# Patient Record
Sex: Female | Born: 1950 | Hispanic: Yes | Smoking: Light tobacco smoker
Health system: Southern US, Community
[De-identification: ages and names within clinical notes are randomized; demographics above are authoritative.]

## PROBLEM LIST (undated history)

## (undated) DIAGNOSIS — E119 Type 2 diabetes mellitus without complications: Secondary | ICD-10-CM

## (undated) DIAGNOSIS — I10 Essential (primary) hypertension: Secondary | ICD-10-CM

## (undated) DIAGNOSIS — F329 Major depressive disorder, single episode, unspecified: Secondary | ICD-10-CM

## (undated) DIAGNOSIS — F32A Depression, unspecified: Secondary | ICD-10-CM

## (undated) DIAGNOSIS — M1990 Unspecified osteoarthritis, unspecified site: Secondary | ICD-10-CM

## (undated) DIAGNOSIS — K759 Inflammatory liver disease, unspecified: Secondary | ICD-10-CM

## (undated) DIAGNOSIS — N189 Chronic kidney disease, unspecified: Secondary | ICD-10-CM

## (undated) DIAGNOSIS — F419 Anxiety disorder, unspecified: Secondary | ICD-10-CM

## (undated) DIAGNOSIS — E7800 Pure hypercholesterolemia, unspecified: Secondary | ICD-10-CM

## (undated) DIAGNOSIS — K219 Gastro-esophageal reflux disease without esophagitis: Secondary | ICD-10-CM

## (undated) DIAGNOSIS — R51 Headache: Secondary | ICD-10-CM

## (undated) HISTORY — PX: TUBAL LIGATION: SHX77

## (undated) HISTORY — PX: CYST EXCISION: SHX5701

---

## 2013-10-24 DIAGNOSIS — R519 Headache, unspecified: Secondary | ICD-10-CM

## 2013-10-24 HISTORY — DX: Headache, unspecified: R51.9

## 2014-01-21 ENCOUNTER — Inpatient Hospital Stay (HOSPITAL_COMMUNITY): Admission: RE | Admit: 2014-01-21 | Payer: Self-pay | Source: Ambulatory Visit

## 2014-01-22 ENCOUNTER — Encounter (HOSPITAL_COMMUNITY)
Admission: RE | Admit: 2014-01-22 | Discharge: 2014-01-22 | Disposition: A | Discharge status: Home / Self Care | Payer: Worker's Compensation | Source: Ambulatory Visit | Attending: Anesthesiology | Admitting: Anesthesiology

## 2014-01-22 ENCOUNTER — Encounter (HOSPITAL_COMMUNITY): Payer: Self-pay

## 2014-01-22 DIAGNOSIS — I771 Stricture of artery: Secondary | ICD-10-CM | POA: Diagnosis not present

## 2014-01-22 DIAGNOSIS — I10 Essential (primary) hypertension: Secondary | ICD-10-CM | POA: Diagnosis not present

## 2014-01-22 DIAGNOSIS — Z720 Tobacco use: Secondary | ICD-10-CM | POA: Insufficient documentation

## 2014-01-22 DIAGNOSIS — E119 Type 2 diabetes mellitus without complications: Secondary | ICD-10-CM | POA: Insufficient documentation

## 2014-01-22 DIAGNOSIS — Z01818 Encounter for other preprocedural examination: Secondary | ICD-10-CM | POA: Insufficient documentation

## 2014-01-22 HISTORY — DX: Inflammatory liver disease, unspecified: K75.9

## 2014-01-22 HISTORY — DX: Anxiety disorder, unspecified: F41.9

## 2014-01-22 HISTORY — DX: Major depressive disorder, single episode, unspecified: F32.9

## 2014-01-22 HISTORY — DX: Headache: R51

## 2014-01-22 HISTORY — DX: Pure hypercholesterolemia, unspecified: E78.00

## 2014-01-22 HISTORY — DX: Essential (primary) hypertension: I10

## 2014-01-22 HISTORY — DX: Chronic kidney disease, unspecified: N18.9

## 2014-01-22 HISTORY — DX: Depression, unspecified: F32.A

## 2014-01-22 HISTORY — DX: Type 2 diabetes mellitus without complications: E11.9

## 2014-01-22 HISTORY — DX: Unspecified osteoarthritis, unspecified site: M19.90

## 2014-01-22 HISTORY — DX: Gastro-esophageal reflux disease without esophagitis: K21.9

## 2014-01-22 LAB — BASIC METABOLIC PANEL
Anion gap: 13 (ref 5–15)
BUN: 15 mg/dL (ref 6–23)
CO2: 24 meq/L (ref 19–32)
Calcium: 9.3 mg/dL (ref 8.4–10.5)
Chloride: 103 mEq/L (ref 96–112)
Creatinine, Ser: 0.52 mg/dL (ref 0.50–1.10)
GFR calc non Af Amer: >90 mL/min (ref >90)
Glucose, Bld: 110 mg/dL — ABNORMAL HIGH (ref 70–99)
POTASSIUM: 4.4 meq/L (ref 3.7–5.3)
Sodium: 140 mEq/L (ref 137–147)

## 2014-01-22 LAB — CBC
HEMATOCRIT: 43.7 % (ref 36.0–46.0)
HEMOGLOBIN: 14.7 g/dL (ref 12.0–15.0)
MCH: 28.7 pg (ref 26.0–34.0)
MCHC: 33.6 g/dL (ref 30.0–36.0)
MCV: 85.2 fL (ref 78.0–100.0)
Platelets: 249 10*3/uL (ref 150–400)
RBC: 5.13 MIL/uL — AB (ref 3.87–5.11)
RDW: 12.7 % (ref 11.5–15.5)
WBC: 6.5 10*3/uL (ref 4.0–10.5)

## 2014-01-22 LAB — TYPE AND SCREEN
ABO/RH(D): O POS
ANTIBODY SCREEN: NEGATIVE

## 2014-01-22 LAB — ABO/RH: ABO/RH(D): O POS

## 2014-01-22 LAB — SURGICAL PCR SCREEN
MRSA, PCR: NEGATIVE
STAPHYLOCOCCUS AUREUS: POSITIVE — AB

## 2014-01-22 NOTE — Progress Notes (Addendum)
Denies seeing a cardiologist or PCP, goes to a clinic when needed, also gets her medications from the clinic. Pt did not bring in her meds today, instructed to call pharm with info about what meds she is taking. Voices understanding.  Interpreter at side Daphine Deutscher([place]).  Reports that her CBG's run on average in the 120's. Instructed not to take her Dm meds the morning of surgery, voices understanding. Denies having a recent EKG or CXR, denies ever having a stress test, echo,or card cath. Interpreter has been requested for the DOS.

## 2014-01-22 NOTE — H&P (Signed)
Erica Hudson is an 63 y.o. female.   History of Present Illness  The patient is a 63 year old female who presents today for follow up of their back. The patient is being followed for their left low back pain that radiates down the posterior thigh to the foot back pain. Symptoms reported today include: pain (when bending), pain at night, aching, throbbing, stiffness, weakness (left leg for about one month), numbness, leg pain (left), foot pain, pain with lifting and pain with standing. The patient states that they are doing poorly. Current treatment includes: NSAIDs (Naprosyn). The following medication has been used for pain control: antiinflammatory medication. The patient reports their current pain level to be 7 / 10.  Additional reasons for visit:  H & P is described as the following: The patient is scheduled for a TLIF L4-5, LEFT L5-S1Decompression to be performed by Dr. Duane Lope D. Rolena Infante, MD at [facility] on 10.29.15 .  Allergies Hassie Bruce Toomes; 01/22/2014 2:05 PM) No Known Drug Allergies10/07/2011  Family History No pertinent family history First Degree Relatives.  Social History Alcohol use never consumed alcohol Children 5 or more Current work status working full time Drug/Alcohol Rehab (Currently) no Drug/Alcohol Rehab (Previously) no Exercise Exercises daily; does running / walking Illicit drug use no Living situation live with parents Marital status widowed Number of flights of stairs before winded 2-3 Pain Contract no Tobacco use Never smoker. never smoker  Medication History Naprosyn (Oral) Specific dose unknown - Active. (bid) Accupril (Oral) Specific dose unknown - Active. (pt stopped taking due to dizziness) MetFORMIN HCl (Oral) Specific dose unknown - Active. (bid) Crestor (Oral) Specific dose unknown - Active. (has been changed but she does not recall the new Rx) Lovaza (Oral) Specific dose unknown - Active. (qd) Medications  Reconciled  Vitals  01/22/2014 2:04 PM Weight: 141 lb Height: 64in Body Surface Area: 1.7 m Body Mass Index: 24.2 kg/m Pulse: 68 (Regular)  BP: 135/81 (Sitting, Left Arm, Standard)   Past Medical History  Diagnosis Date  . High cholesterol   . Diabetes mellitus without complication   . Hypertension   . Depression   . Anxiety   . Chronic kidney disease   . GERD (gastroesophageal reflux disease)   . Headache October 24, 2013    mirror hit her head, and headaches at times since this  . Arthritis   . Hepatitis     Past Surgical History  Procedure Laterality Date  . Cyst excision      cyst removed from bladder  . Tubal ligation      No family history on file. Social History:  reports that she has been smoking Cigarettes.  She has been smoking about 0.25 packs per day. She does not have any smokeless tobacco history on file. She reports that she does not drink alcohol or use illicit drugs.  Allergies: Not on File  No prescriptions prior to admission    Results for orders placed during the hospital encounter of 01/22/14 (from the past 48 hour(s))  SURGICAL PCR SCREEN     Status: Abnormal   Collection Time    01/22/14 10:38 AM      Result Value Ref Range   MRSA, PCR NEGATIVE  NEGATIVE   Staphylococcus aureus POSITIVE (*) NEGATIVE   Comment:            The Xpert SA Assay (FDA     approved for NASAL specimens     in patients over 40 years of age),  is one component of     a comprehensive surveillance     program.  Test performance has     been validated by [facility] for patients greater     than or equal to 33 year old.     It is not intended     to diagnose infection nor to     guide or monitor treatment.  BASIC METABOLIC PANEL     Status: Abnormal   Collection Time    01/22/14 10:52 AM      Result Value Ref Range   Sodium 140  137 - 147 mEq/L   Potassium 4.4  3.7 - 5.3 mEq/L   Chloride 103  96 - 112 mEq/L   CO2 24  19 - 32 mEq/L   Glucose,  Bld 110 (*) 70 - 99 mg/dL   BUN 15  6 - 23 mg/dL   Creatinine, Ser 0.52  0.50 - 1.10 mg/dL   Calcium 9.3  8.4 - 10.5 mg/dL   GFR calc non Af Amer >90  >90 mL/min   GFR calc Af Amer >90  >90 mL/min   Comment: (NOTE)     The eGFR has been calculated using the CKD EPI equation.     This calculation has not been validated in all clinical situations.     eGFR's persistently <90 mL/min signify possible Chronic Kidney     Disease.   Anion gap 13  5 - 15  CBC     Status: Abnormal   Collection Time    01/22/14 10:52 AM      Result Value Ref Range   WBC 6.5  4.0 - 10.5 K/uL   RBC 5.13 (*) 3.87 - 5.11 MIL/uL   Hemoglobin 14.7  12.0 - 15.0 g/dL   HCT 43.7  36.0 - 46.0 %   MCV 85.2  78.0 - 100.0 fL   MCH 28.7  26.0 - 34.0 pg   MCHC 33.6  30.0 - 36.0 g/dL   RDW 12.7  11.5 - 15.5 %   Platelets 249  150 - 400 K/uL   Dg Chest 2 View  01/22/2014   CLINICAL DATA:  Preop for lumbar fusion. History of hypertension, diabetes and smoking.  EXAM: CHEST  2 VIEW  COMPARISON:  None.  FINDINGS: Cardiac silhouette is normal in size. Aorta is mildly uncoiled and tortuous. No mediastinal or hilar masses or evidence of adenopathy. Lungs are mildly hyperexpanded but clear. No pleural effusion or pneumothorax.  Bony thorax is demineralized but intact.  IMPRESSION: No acute cardiopulmonary disease.   Electronically Signed   By: Lajean Manes M.D.   On: 01/22/2014 11:42    Review of Systems  Constitutional: Negative.   HENT: Negative.   Eyes: Negative.   Respiratory: Negative.   Cardiovascular: Negative.   Gastrointestinal: Positive for abdominal pain (left lower abd. ). Negative for diarrhea, constipation and blood in stool.  Genitourinary: Negative.   Musculoskeletal: Positive for back pain.  Skin: Negative.   Neurological: Negative.   Psychiatric/Behavioral: Negative.     There were no vitals taken for this visit. Physical Exam  Constitutional: She is oriented to person, place, and time. She appears  well-developed and well-nourished.  HENT:  Head: Normocephalic and atraumatic.  Eyes: Pupils are equal, round, and reactive to light.  Neck: Normal range of motion.  Cardiovascular: Normal rate and regular rhythm.   Respiratory: Effort normal and breath sounds normal.  GI: Soft. Bowel sounds are normal.  She exhibits no mass. There is tenderness (left lower and upper quadrant.  ). There is no rebound.  Musculoskeletal: Normal range of motion.  Neurological: She is alert and oriented to person, place, and time.  Skin: Skin is warm and dry.  Psychiatric: She has a normal mood and affect.     Assessment/Plan L4-5, L5-S1 ddd/stenosis.  Proceed with L4-5 TLIF and L5-S1 decompression.    Posterior decompression/Fusion:Risks of surgery include infection, bleeding, nerve damage, death, stroke, paralysis, failure to heal, need for further surgery, ongoing or worse pain, need for further surgery, CSF leak, loss of bowel or bladder, and recurrent disc herniation or stenosis which would necessitate need for further surgery. Non-union, hardware failure, adjacent segment disease and recurrent pain. Hardware breakage, mal-position requiring surgery to correct or remove.     Uchenna Rappaport M 01/22/2014, 2:52 PM

## 2014-01-22 NOTE — Pre-Procedure Instructions (Signed)
Erica GroutMicaela Hudson  01/22/2014   Your procedure is scheduled on: Thursday, January 28, 2014  [facility] at 10:15 AM.  Call this number if you have problems the morning of surgery: [phone]   Remember:   Do not eat food or drink liquids after midnight Wednesday, January 27, 2014   Take these medicines the morning of surgery with A SIP OF WATER: Pain med if needed  Stop taking Aspirin, Herbal medications, Fish Oil, Ibuprofen, BC's, Goody's   Do not wear jewelry, make-up or nail polish.  Do not wear lotions, powders, or perfumes. You may not wear deodorant.  Do not shave 48 hours prior to surgery.   Do not bring valuables to the hospital.  [facility] is not [facility] for any belongings or valuables.               Contacts, dentures or bridgework may not be worn into surgery.  Leave suitcase in the car. After surgery it may be brought to your room.  For patients admitted to the hospital, discharge time is determined by your treatment team.               Patients discharged the day of surgery will not be allowed to drive home.  Name and phone number of your driver:   Special Instructions:  Special Instructions:Special Instructions: [facility] - Pre[facility]  Before surgery, you can play an important role.  Because skin is not sterile, your skin needs to be as free of germs as possible.  You can reduce the number of germs on you skin by washing with CHG (chlorahexidine gluconate) soap before surgery.  CHG is an antiseptic cleaner which kills germs and bonds with the skin to continue killing germs even after washing.  Please DO NOT use if you have an allergy to CHG or antibacterial soaps.  If your skin becomes reddened/irritated stop using the CHG and inform your nurse when you arrive at Short Stay.  Do not shave (including legs and underarms) for at least 48 hours prior to the first CHG shower.  You may shave your face.  Please follow these  instructions carefully:   1.  Shower with CHG Soap the night before surgery and the morning of Surgery.  2.  If you choose to wash your hair, wash your hair first as usual with your normal shampoo.  3.  After you shampoo, rinse your hair and body thoroughly to remove the Shampoo.  4.  Use CHG as you would any other liquid soap.  You can apply chg directly  to the skin and wash gently with scrungie or a clean washcloth.  5.  Apply the CHG Soap to your body ONLY FROM THE NECK DOWN.  Do not use on open wounds or open sores.  Avoid contact with your eyes, ears, mouth and genitals (private parts).  Wash genitals (private parts) with your normal soap.  6.  Wash thoroughly, paying special attention to the area where your surgery will be performed.  7.  Thoroughly rinse your body with warm water from the neck down.  8.  DO NOT shower/wash with your normal soap after using and rinsing off the CHG Soap.  9.  Pat yourself dry with a clean towel.            10.  Wear clean pajamas.            11.  Place clean sheets on your bed the  night of your first shower and do not sleep with pets.  Day of Surgery  Do not apply any lotions/deodorants the morning of surgery.  Please wear clean clothes to the hospital/surgery center.   Please read over the following fact sheets that you were given: Pain Booklet, Coughing and Deep Breathing, Blood Transfusion Information, MRSA Information and Surgical Site Infection Prevention

## 2014-01-22 NOTE — Progress Notes (Signed)
[facility] [phone] script called for Mupirocin message left on Ms Erica LollingRamirez sister in laws voice mail per her request.

## 2014-01-27 MED ORDER — DEXAMETHASONE SODIUM PHOSPHATE 4 MG/ML IJ SOLN
4.0000 mg | INTRAMUSCULAR | Status: AC
  Administered 2014-01-28: 10 mg via INTRAVENOUS
  Filled 2014-01-27: qty 1

## 2014-01-27 MED ORDER — ACETAMINOPHEN 10 MG/ML IV SOLN
1000.0000 mg | INTRAVENOUS | Status: AC
  Administered 2014-01-28: 1000 mg via INTRAVENOUS
  Filled 2014-01-27: qty 100

## 2014-01-28 ENCOUNTER — Inpatient Hospital Stay (HOSPITAL_COMMUNITY): Payer: Worker's Compensation | Admitting: Anesthesiology

## 2014-01-28 ENCOUNTER — Encounter (HOSPITAL_COMMUNITY): Payer: Worker's Compensation | Admitting: Anesthesiology

## 2014-01-28 ENCOUNTER — Inpatient Hospital Stay (HOSPITAL_COMMUNITY): Payer: Worker's Compensation

## 2014-01-28 ENCOUNTER — Encounter (HOSPITAL_COMMUNITY): Payer: Self-pay | Admitting: Anesthesiology

## 2014-01-28 ENCOUNTER — Encounter (HOSPITAL_COMMUNITY)
Admission: RE | Disposition: A | Discharge status: Home Health | Payer: Self-pay | Source: Ambulatory Visit | Attending: Orthopedic Surgery

## 2014-01-28 ENCOUNTER — Inpatient Hospital Stay (HOSPITAL_COMMUNITY)
Admission: RE | Admit: 2014-01-28 | Discharge: 2014-01-31 | DRG: 460 | Disposition: A | Discharge status: Home Health | Payer: Worker's Compensation | Source: Ambulatory Visit | Attending: Orthopedic Surgery | Admitting: Orthopedic Surgery

## 2014-01-28 DIAGNOSIS — N189 Chronic kidney disease, unspecified: Secondary | ICD-10-CM | POA: Diagnosis present

## 2014-01-28 DIAGNOSIS — M549 Dorsalgia, unspecified: Secondary | ICD-10-CM | POA: Diagnosis present

## 2014-01-28 DIAGNOSIS — M5137 Other intervertebral disc degeneration, lumbosacral region: Secondary | ICD-10-CM | POA: Diagnosis present

## 2014-01-28 DIAGNOSIS — Y831 Surgical operation with implant of artificial internal device as the cause of abnormal reaction of the patient, or of later complication, without mention of misadventure at the time of the procedure: Secondary | ICD-10-CM | POA: Diagnosis not present

## 2014-01-28 DIAGNOSIS — Y92234 Operating room of hospital as the place of occurrence of the external cause: Secondary | ICD-10-CM | POA: Diagnosis not present

## 2014-01-28 DIAGNOSIS — F329 Major depressive disorder, single episode, unspecified: Secondary | ICD-10-CM | POA: Diagnosis present

## 2014-01-28 DIAGNOSIS — K219 Gastro-esophageal reflux disease without esophagitis: Secondary | ICD-10-CM | POA: Diagnosis present

## 2014-01-28 DIAGNOSIS — M4806 Spinal stenosis, lumbar region: Secondary | ICD-10-CM | POA: Diagnosis present

## 2014-01-28 DIAGNOSIS — T84226A Displacement of internal fixation device of vertebrae, initial encounter: Secondary | ICD-10-CM | POA: Diagnosis not present

## 2014-01-28 DIAGNOSIS — I129 Hypertensive chronic kidney disease with stage 1 through stage 4 chronic kidney disease, or unspecified chronic kidney disease: Secondary | ICD-10-CM | POA: Diagnosis present

## 2014-01-28 DIAGNOSIS — K5900 Constipation, unspecified: Secondary | ICD-10-CM | POA: Diagnosis not present

## 2014-01-28 DIAGNOSIS — F419 Anxiety disorder, unspecified: Secondary | ICD-10-CM | POA: Diagnosis present

## 2014-01-28 DIAGNOSIS — Z79899 Other long term (current) drug therapy: Secondary | ICD-10-CM | POA: Diagnosis not present

## 2014-01-28 DIAGNOSIS — Z419 Encounter for procedure for purposes other than remedying health state, unspecified: Secondary | ICD-10-CM

## 2014-01-28 DIAGNOSIS — M4316 Spondylolisthesis, lumbar region: Secondary | ICD-10-CM | POA: Diagnosis present

## 2014-01-28 DIAGNOSIS — F17210 Nicotine dependence, cigarettes, uncomplicated: Secondary | ICD-10-CM | POA: Diagnosis present

## 2014-01-28 DIAGNOSIS — Z981 Arthrodesis status: Secondary | ICD-10-CM

## 2014-01-28 DIAGNOSIS — E119 Type 2 diabetes mellitus without complications: Secondary | ICD-10-CM | POA: Diagnosis present

## 2014-01-28 DIAGNOSIS — E780 Pure hypercholesterolemia: Secondary | ICD-10-CM | POA: Diagnosis present

## 2014-01-28 DIAGNOSIS — M545 Low back pain: Secondary | ICD-10-CM | POA: Diagnosis present

## 2014-01-28 LAB — GLUCOSE, CAPILLARY
GLUCOSE-CAPILLARY: 141 mg/dL — AB (ref 70–99)
GLUCOSE-CAPILLARY: 160 mg/dL — AB (ref 70–99)
GLUCOSE-CAPILLARY: 173 mg/dL — AB (ref 70–99)
Glucose-Capillary: 108 mg/dL — ABNORMAL HIGH (ref 70–99)

## 2014-01-28 LAB — PROTIME-INR
INR: 1.01 (ref 0.00–1.49)
Prothrombin Time: 13.4 seconds (ref 11.6–15.2)

## 2014-01-28 LAB — APTT: APTT: 33 s (ref 24–37)

## 2014-01-28 SURGERY — POSTERIOR LUMBAR FUSION 1 LEVEL
Anesthesia: General | Laterality: Left

## 2014-01-28 MED ORDER — PROPOFOL 10 MG/ML IV BOLUS
INTRAVENOUS | Status: AC
  Filled 2014-01-28: qty 20

## 2014-01-28 MED ORDER — PROPOFOL INFUSION 10 MG/ML OPTIME
INTRAVENOUS | Status: DC | PRN
  Administered 2014-01-28: 75 ug/kg/min via INTRAVENOUS

## 2014-01-28 MED ORDER — LACTATED RINGERS IV SOLN
INTRAVENOUS | Status: DC | PRN
  Administered 2014-01-28 (×3): via INTRAVENOUS

## 2014-01-28 MED ORDER — INFLUENZA VAC SPLIT QUAD 0.5 ML IM SUSY
0.5000 mL | PREFILLED_SYRINGE | INTRAMUSCULAR | Status: AC
  Administered 2014-01-29: 0.5 mL via INTRAMUSCULAR
  Filled 2014-01-28: qty 0.5

## 2014-01-28 MED ORDER — THROMBIN 20000 UNITS EX SOLR
CUTANEOUS | Status: AC
  Filled 2014-01-28: qty 20000

## 2014-01-28 MED ORDER — MIDAZOLAM HCL 2 MG/2ML IJ SOLN
INTRAMUSCULAR | Status: AC
  Filled 2014-01-28: qty 2

## 2014-01-28 MED ORDER — BUPIVACAINE-EPINEPHRINE (PF) 0.25% -1:200000 IJ SOLN
INTRAMUSCULAR | Status: AC
  Filled 2014-01-28: qty 30

## 2014-01-28 MED ORDER — OXYCODONE HCL 5 MG PO TABS
10.0000 mg | ORAL_TABLET | ORAL | Status: DC | PRN
  Administered 2014-01-28 – 2014-01-31 (×10): 10 mg via ORAL
  Filled 2014-01-28 (×10): qty 2

## 2014-01-28 MED ORDER — FENTANYL CITRATE 0.05 MG/ML IJ SOLN
INTRAMUSCULAR | Status: AC
  Filled 2014-01-28: qty 5

## 2014-01-28 MED ORDER — ONDANSETRON HCL 4 MG/2ML IJ SOLN: 4.0000 mg | INTRAMUSCULAR | Status: DC | PRN

## 2014-01-28 MED ORDER — LISINOPRIL 10 MG PO TABS
10.0000 mg | ORAL_TABLET | Freq: Every day | ORAL | Status: DC
  Administered 2014-01-29 – 2014-01-31 (×3): 10 mg via ORAL
  Filled 2014-01-28 (×3): qty 1

## 2014-01-28 MED ORDER — PHENYLEPHRINE HCL 10 MG/ML IJ SOLN
INTRAMUSCULAR | Status: DC | PRN
  Administered 2014-01-28 (×3): 40 ug via INTRAVENOUS
  Administered 2014-01-28: 80 ug via INTRAVENOUS
  Administered 2014-01-28 (×2): 40 ug via INTRAVENOUS

## 2014-01-28 MED ORDER — METFORMIN HCL 500 MG PO TABS
1000.0000 mg | ORAL_TABLET | Freq: Two times a day (BID) | ORAL | Status: DC
  Administered 2014-01-28 – 2014-01-31 (×6): 1000 mg via ORAL
  Filled 2014-01-28 (×8): qty 2

## 2014-01-28 MED ORDER — DEXAMETHASONE SODIUM PHOSPHATE 4 MG/ML IJ SOLN
4.0000 mg | Freq: Four times a day (QID) | INTRAMUSCULAR | Status: DC
  Filled 2014-01-28 (×15): qty 1

## 2014-01-28 MED ORDER — LACTATED RINGERS IV SOLN: INTRAVENOUS | Status: DC

## 2014-01-28 MED ORDER — LIDOCAINE HCL (CARDIAC) 20 MG/ML IV SOLN
INTRAVENOUS | Status: AC
  Filled 2014-01-28: qty 5

## 2014-01-28 MED ORDER — MORPHINE SULFATE 2 MG/ML IJ SOLN: 1.0000 mg | INTRAMUSCULAR | Status: DC | PRN

## 2014-01-28 MED ORDER — PNEUMOCOCCAL VAC POLYVALENT 25 MCG/0.5ML IJ INJ
0.5000 mL | INJECTION | INTRAMUSCULAR | Status: AC
  Administered 2014-01-29: 0.5 mL via INTRAMUSCULAR
  Filled 2014-01-28: qty 0.5

## 2014-01-28 MED ORDER — DEXAMETHASONE SODIUM PHOSPHATE 10 MG/ML IJ SOLN
INTRAMUSCULAR | Status: AC
  Filled 2014-01-28: qty 1

## 2014-01-28 MED ORDER — BUPIVACAINE-EPINEPHRINE 0.25% -1:200000 IJ SOLN
INTRAMUSCULAR | Status: DC | PRN
  Administered 2014-01-28: 20 mL

## 2014-01-28 MED ORDER — ONDANSETRON HCL 4 MG/2ML IJ SOLN
INTRAMUSCULAR | Status: AC
  Filled 2014-01-28: qty 2

## 2014-01-28 MED ORDER — METHOCARBAMOL 500 MG PO TABS
500.0000 mg | ORAL_TABLET | Freq: Four times a day (QID) | ORAL | Status: DC | PRN
  Administered 2014-01-29 – 2014-01-30 (×5): 500 mg via ORAL
  Filled 2014-01-28 (×6): qty 1

## 2014-01-28 MED ORDER — 0.9 % SODIUM CHLORIDE (POUR BTL) OPTIME
TOPICAL | Status: DC | PRN
  Administered 2014-01-28: 1000 mL

## 2014-01-28 MED ORDER — METHOCARBAMOL 1000 MG/10ML IJ SOLN
500.0000 mg | Freq: Four times a day (QID) | INTRAVENOUS | Status: DC | PRN
  Filled 2014-01-28: qty 5

## 2014-01-28 MED ORDER — PROPOFOL 10 MG/ML IV BOLUS
INTRAVENOUS | Status: DC | PRN
  Administered 2014-01-28: 100 mg via INTRAVENOUS

## 2014-01-28 MED ORDER — GELATIN ABSORBABLE MT POWD
OROMUCOSAL | Status: DC | PRN
  Administered 2014-01-28: 12:00:00 via TOPICAL

## 2014-01-28 MED ORDER — CEFAZOLIN SODIUM 1-5 GM-% IV SOLN
1.0000 g | Freq: Three times a day (TID) | INTRAVENOUS | Status: AC
  Administered 2014-01-28 – 2014-01-29 (×2): 1 g via INTRAVENOUS
  Filled 2014-01-28 (×2): qty 50

## 2014-01-28 MED ORDER — SODIUM CHLORIDE 0.9 % IJ SOLN: 3.0000 mL | INTRAMUSCULAR | Status: DC | PRN

## 2014-01-28 MED ORDER — LIDOCAINE HCL (CARDIAC) 20 MG/ML IV SOLN
INTRAVENOUS | Status: DC | PRN
  Administered 2014-01-28: 60 mg via INTRAVENOUS

## 2014-01-28 MED ORDER — SUCCINYLCHOLINE CHLORIDE 20 MG/ML IJ SOLN
INTRAMUSCULAR | Status: DC | PRN
  Administered 2014-01-28: 100 mg via INTRAVENOUS

## 2014-01-28 MED ORDER — ACETAMINOPHEN 10 MG/ML IV SOLN
1000.0000 mg | Freq: Four times a day (QID) | INTRAVENOUS | Status: AC
  Administered 2014-01-28 – 2014-01-29 (×4): 1000 mg via INTRAVENOUS
  Filled 2014-01-28 (×5): qty 100

## 2014-01-28 MED ORDER — CEFAZOLIN SODIUM-DEXTROSE 2-3 GM-% IV SOLR
2.0000 g | INTRAVENOUS | Status: AC
  Administered 2014-01-28: 2 g via INTRAVENOUS
  Filled 2014-01-28: qty 50

## 2014-01-28 MED ORDER — PHENOL 1.4 % MT LIQD: 1.0000 | OROMUCOSAL | Status: DC | PRN

## 2014-01-28 MED ORDER — HEMOSTATIC AGENTS (NO CHARGE) OPTIME
TOPICAL | Status: DC | PRN
  Administered 2014-01-28 (×2): 1 via TOPICAL

## 2014-01-28 MED ORDER — LINAGLIPTIN 5 MG PO TABS
5.0000 mg | ORAL_TABLET | Freq: Every day | ORAL | Status: DC
  Administered 2014-01-29 – 2014-01-31 (×3): 5 mg via ORAL
  Filled 2014-01-28 (×4): qty 1

## 2014-01-28 MED ORDER — MENTHOL 3 MG MT LOZG: 1.0000 | LOZENGE | OROMUCOSAL | Status: DC | PRN

## 2014-01-28 MED ORDER — MIDAZOLAM HCL 5 MG/5ML IJ SOLN
INTRAMUSCULAR | Status: DC | PRN
  Administered 2014-01-28: 2 mg via INTRAVENOUS

## 2014-01-28 MED ORDER — ROCURONIUM BROMIDE 50 MG/5ML IV SOLN
INTRAVENOUS | Status: AC
  Filled 2014-01-28: qty 1

## 2014-01-28 MED ORDER — FENTANYL CITRATE 0.05 MG/ML IJ SOLN
INTRAMUSCULAR | Status: DC | PRN
  Administered 2014-01-28 (×3): 50 ug via INTRAVENOUS
  Administered 2014-01-28: 100 ug via INTRAVENOUS
  Administered 2014-01-28 (×3): 50 ug via INTRAVENOUS

## 2014-01-28 MED ORDER — SITAGLIPTIN PHOS-METFORMIN HCL 50-1000 MG PO TABS: 1.0000 | ORAL_TABLET | Freq: Two times a day (BID) | ORAL | Status: DC

## 2014-01-28 MED ORDER — ONDANSETRON HCL 4 MG/2ML IJ SOLN
INTRAMUSCULAR | Status: DC | PRN
  Administered 2014-01-28: 4 mg via INTRAVENOUS

## 2014-01-28 MED ORDER — LACTATED RINGERS IV SOLN
INTRAVENOUS | Status: DC
  Administered 2014-01-28: 11:00:00 via INTRAVENOUS

## 2014-01-28 MED ORDER — DEXAMETHASONE 4 MG PO TABS
4.0000 mg | ORAL_TABLET | Freq: Four times a day (QID) | ORAL | Status: DC
  Administered 2014-01-28 – 2014-01-31 (×12): 4 mg via ORAL
  Filled 2014-01-28 (×16): qty 1

## 2014-01-28 MED ORDER — PRAVASTATIN SODIUM 20 MG PO TABS
20.0000 mg | ORAL_TABLET | Freq: Every day | ORAL | Status: DC
  Administered 2014-01-28 – 2014-01-30 (×3): 20 mg via ORAL
  Filled 2014-01-28 (×4): qty 1

## 2014-01-28 MED ORDER — SODIUM CHLORIDE 0.9 % IV SOLN: 250.0000 mL | INTRAVENOUS | Status: DC

## 2014-01-28 MED ORDER — SODIUM CHLORIDE 0.9 % IJ SOLN
3.0000 mL | Freq: Two times a day (BID) | INTRAMUSCULAR | Status: DC
  Administered 2014-01-29 – 2014-01-31 (×2): 3 mL via INTRAVENOUS

## 2014-01-28 SURGICAL SUPPLY — 79 items
BLADE DERMATOME SS (BLADE) ×3 IMPLANT
BLADE SURG ROTATE 9660 (MISCELLANEOUS) IMPLANT
BUR EGG ELITE 4.0 (BURR) IMPLANT
BUR EGG ELITE 4.0MM (BURR)
CLIP NEUROVISION LG (CLIP) ×3 IMPLANT
CLOSURE STERI-STRIP 1/2X4 (GAUZE/BANDAGES/DRESSINGS) ×1
CLOSURE WOUND 1/2 X4 (GAUZE/BANDAGES/DRESSINGS) ×1
CLSR STERI-STRIP ANTIMIC 1/2X4 (GAUZE/BANDAGES/DRESSINGS) ×2 IMPLANT
CORDS BIPOLAR (ELECTRODE) ×3 IMPLANT
COVER MAYO STAND STRL (DRAPES) ×6 IMPLANT
COVER SURGICAL LIGHT HANDLE (MISCELLANEOUS) ×3 IMPLANT
DRAPE C-ARM 42X72 X-RAY (DRAPES) ×3 IMPLANT
DRAPE C-ARMOR (DRAPES) ×3 IMPLANT
DRAPE ORTHO SPLIT 77X108 STRL (DRAPES) ×2
DRAPE POUCH INSTRU U-SHP 10X18 (DRAPES) ×3 IMPLANT
DRAPE SURG 17X23 STRL (DRAPES) ×3 IMPLANT
DRAPE SURG ORHT 6 SPLT 77X108 (DRAPES) ×1 IMPLANT
DRAPE U-SHAPE 47X51 STRL (DRAPES) ×3 IMPLANT
DRSG MEPILEX BORDER 4X8 (GAUZE/BANDAGES/DRESSINGS) ×3 IMPLANT
DURAPREP 26ML APPLICATOR (WOUND CARE) ×3 IMPLANT
ELECT BLADE 4.0 EZ CLEAN MEGAD (MISCELLANEOUS) ×3
ELECT BLADE 6.5 EXT (BLADE) IMPLANT
ELECT PENCIL ROCKER SW 15FT (MISCELLANEOUS) ×3 IMPLANT
ELECT REM PT RETURN 9FT ADLT (ELECTROSURGICAL) ×3
ELECTRODE BLDE 4.0 EZ CLN MEGD (MISCELLANEOUS) ×1 IMPLANT
ELECTRODE REM PT RTRN 9FT ADLT (ELECTROSURGICAL) ×1 IMPLANT
GLOVE BIOGEL PI IND STRL 8 (GLOVE) ×1 IMPLANT
GLOVE BIOGEL PI IND STRL 8.5 (GLOVE) ×1 IMPLANT
GLOVE BIOGEL PI INDICATOR 8 (GLOVE) ×2
GLOVE BIOGEL PI INDICATOR 8.5 (GLOVE) ×2
GLOVE ECLIPSE 8.5 STRL (GLOVE) ×3 IMPLANT
GLOVE ORTHO TXT STRL SZ7.5 (GLOVE) ×3 IMPLANT
GOWN STRL REUS W/ TWL LRG LVL3 (GOWN DISPOSABLE) ×1 IMPLANT
GOWN STRL REUS W/TWL 2XL LVL3 (GOWN DISPOSABLE) ×6 IMPLANT
GOWN STRL REUS W/TWL LRG LVL3 (GOWN DISPOSABLE) ×2
GUIDEWIRE NITINOL BEVEL TIP (WIRE) ×3 IMPLANT
KIT BASIN OR (CUSTOM PROCEDURE TRAY) ×3 IMPLANT
KIT NEEDLE NVM5 EMG ELECT (KITS) ×1 IMPLANT
KIT NEEDLE NVM5 EMG ELECTRODE (KITS) ×2
KIT POSITION SURG JACKSON T1 (MISCELLANEOUS) ×3 IMPLANT
KIT ROOM TURNOVER OR (KITS) ×3 IMPLANT
LIGHT SOURCE ANGLE TIP STR 7FT (MISCELLANEOUS) ×3 IMPLANT
MAS TLIF HOOP SHIM (KITS) ×3 IMPLANT
NEEDLE 22X1 1/2 (OR ONLY) (NEEDLE) ×3 IMPLANT
NEEDLE I-PASS III (NEEDLE) ×3 IMPLANT
NEEDLE SPNL 18GX3.5 QUINCKE PK (NEEDLE) ×6 IMPLANT
NS IRRIG 1000ML POUR BTL (IV SOLUTION) ×3 IMPLANT
PACK LAMINECTOMY ORTHO (CUSTOM PROCEDURE TRAY) ×3 IMPLANT
PACK UNIVERSAL I (CUSTOM PROCEDURE TRAY) ×3 IMPLANT
PAD ARMBOARD 7.5X6 YLW CONV (MISCELLANEOUS) ×6 IMPLANT
PATTIES SURGICAL .5 X.5 (GAUZE/BANDAGES/DRESSINGS) IMPLANT
PATTIES SURGICAL .5 X1 (DISPOSABLE) ×3 IMPLANT
PRECEPT SHANK CANN 6.5X40 (Neuro Prosthesis/Implant) ×6 IMPLANT
PRECEPT TULIPS (Neuro Prosthesis/Implant) ×6 IMPLANT
PROBE BALL TIP NVM5 SNG USE (BALLOONS) ×3 IMPLANT
PUTTY DBX 1CC (Putty) ×3 IMPLANT
PUTTY DBX 1CC DEPUY (Putty) ×1 IMPLANT
ROD PREBENT 40MM (Rod) ×6 IMPLANT
SCREW PRECEPT 6.5X40 (Screw) ×6 IMPLANT
SCREW PRECEPT POLYAXIAL 7.5X40 (Screw) ×3 IMPLANT
SCREW PRECEPT SET (Screw) ×12 IMPLANT
SHEET CONFORM 45LX20WX5H (Bone Implant) ×3 IMPLANT
SPONGE LAP 4X18 X RAY DECT (DISPOSABLE) ×9 IMPLANT
SPONGE SURGIFOAM ABS GEL 100 (HEMOSTASIS) ×3 IMPLANT
STRIP CLOSURE SKIN 1/2X4 (GAUZE/BANDAGES/DRESSINGS) ×2 IMPLANT
SURGIFLO TRUKIT (HEMOSTASIS) ×6 IMPLANT
SUT BONE WAX W31G (SUTURE) ×3 IMPLANT
SUT MON AB 3-0 SH 27 (SUTURE) ×4
SUT MON AB 3-0 SH27 (SUTURE) ×2 IMPLANT
SUT VIC AB 1 CTX 18 (SUTURE) ×3 IMPLANT
SUT VIC AB 2-0 CT1 18 (SUTURE) ×3 IMPLANT
SYR BULB IRRIGATION 50ML (SYRINGE) ×3 IMPLANT
SYR CONTROL 10ML LL (SYRINGE) ×3 IMPLANT
TLIF XLRG 11MM (Neuro Prosthesis/Implant) ×3 IMPLANT
TOWEL OR 17X24 6PK STRL BLUE (TOWEL DISPOSABLE) ×3 IMPLANT
TOWEL OR 17X26 10 PK STRL BLUE (TOWEL DISPOSABLE) ×3 IMPLANT
TRAY FOLEY CATH 16FRSI W/METER (SET/KITS/TRAYS/PACK) ×3 IMPLANT
WATER STERILE IRR 1000ML POUR (IV SOLUTION) ×3 IMPLANT
YANKAUER SUCT BULB TIP NO VENT (SUCTIONS) ×3 IMPLANT

## 2014-01-28 NOTE — Brief Op Note (Signed)
01/28/2014  2:48 PM  PATIENT:  Erica GroutMicaela Hudson  63 y.o. female  PRE-OPERATIVE DIAGNOSIS:  FORAMINAL STENOSIS  DEGENERATIVE SPONDYLOTHESIS   POST-OPERATIVE DIAGNOSIS:  foraminal stenosis degenerative spondylothesis  PROCEDURE:  Procedure(s): POSTERIOR LUMBAR FUSION 1 LEVEL L4-L5   (TLIFT)  LEFT L5-S1 DECOMPRESSION  (Left)  SURGEON:  Surgeon(s) and Role:    * Venita Lickahari Beaulah Romanek, MD - Primary  PHYSICIAN ASSISTANT:   ASSISTANTS: Zonia KiefJames Owens    ANESTHESIA:   general  EBL:  Total I/O In: 2000 [I.V.:2000] Out: 550 [Urine:250; Blood:300]  BLOOD ADMINISTERED:none  DRAINS: none   LOCAL MEDICATIONS USED:  MARCAINE     SPECIMEN:  No Specimen  DISPOSITION OF SPECIMEN:  N/A  COUNTS:  YES  TOURNIQUET:  * No tourniquets in log *  DICTATION: .Other Dictation: Dictation Number [id number]  PLAN OF CARE: Admit to inpatient   PATIENT DISPOSITION:  PACU - hemodynamically stable.

## 2014-01-28 NOTE — H&P (Signed)
Agree with above. Patient with neuropathic leg pain and significant back pain. Structural abnormality of L4/5 and spinal stenosis L5/1  Plan onf decompression and stabilization L4/5 and decompression L5/S1

## 2014-01-28 NOTE — Anesthesia Preprocedure Evaluation (Addendum)
Anesthesia Evaluation  Patient identified by MRN, date of birth, ID band Patient awake    Reviewed: Allergy & Precautions, H&P , NPO status , Patient's Chart, lab work & pertinent test results  Airway Mallampati: II  TM Distance: >3 FB Neck ROM: Full    Dental  (+) Teeth Intact, Dental Advisory Given   Pulmonary Current Smoker,          Cardiovascular hypertension,     Neuro/Psych  Headaches, Anxiety Depression    GI/Hepatic Neg liver ROS, GERD-  ,  Endo/Other  diabetes  Renal/GU negative Renal ROS     Musculoskeletal  (+) Arthritis -,   Abdominal   Peds  Hematology negative hematology ROS (+)   Anesthesia Other Findings   Reproductive/Obstetrics                            Anesthesia Physical Anesthesia Plan  ASA: II  Anesthesia Plan: General   Post-op Pain Management:    Induction: Intravenous  Airway Management Planned: Oral ETT  Additional Equipment:   Intra-op Plan:   Post-operative Plan: Extubation in OR  Informed Consent: I have reviewed the patients History and Physical, chart, labs and discussed the procedure including the risks, benefits and alternatives for the proposed anesthesia with the patient or authorized representative who has indicated his/her understanding and acceptance.   Dental advisory given  Plan Discussed with: CRNA and Surgeon  Anesthesia Plan Comments:         Anesthesia Quick Evaluation

## 2014-01-28 NOTE — Progress Notes (Signed)
Orthopedic Tech Progress Note Patient Details:  Erica Hudson [birth year] [id number] Brace done by bio-tech vendor. Patient ID: Erica Hudson, female   DOB: [birth year], 63 y.o.   MRN: [id number]   Erica Hudson, Erica Hudson 01/28/2014, 6:02 PM

## 2014-01-28 NOTE — Progress Notes (Signed)
Interpreter at bedside.

## 2014-01-28 NOTE — Op Note (Signed)
Erica Hudson:  Erica Hudson             ACCOUNT NO.:  [id number]  MEDICAL RECORD NO.:  [id number]  LOCATION:  [id number]                        FACILITY:  [facility]  PHYSICIAN:  Erica Bealahari D Danthony Kendrix, MD    DATE OF BIRTH:  1951/02/16  DATE OF PROCEDURE:  01/28/2014 DATE OF DISCHARGE:                              OPERATIVE REPORT   PREOPERATIVE DIAGNOSES:  Lumbar spinal stenosis, L5-S1, left lateral recess stenosis L5-S1, and grade 2 degenerative spondylolisthesis, L4-5 with left neuropathic leg pain.  POSTOPERATIVE DIAGNOSES:  Lumbar spinal stenosis, L5-S1, left lateral recess stenosis L5-S1, and grade 2 degenerative spondylolisthesis, L4-5 with left neuropathic leg pain.  OPERATIVE PROCEDURE: 1. Gill decompression, L4-5 left side with complete facetectomy. 2. L5-S1 hemilaminotomy with lateral recess decompression. 3. Complete diskectomy, L4-5 with implantation of biomechanical     intervertebral device, L4-5. 4. Posterolateral pedicle screw fixation, L4-5 utilizing the     unilateral pedicle screw fixation L4-5. 5. Posterolateral arthrodesis, L4-5.  COMPLICATIONS:  Failure of left L4 pedicle screw.  This screw was grossly loose, it was removed. I palpated the pedicle with a ball-tip Feeler. It was felt to be intact with no breach. It was re-tapped and a larger 7.5 screw was placed with apparent good purchase, however, during fixation to the rod, it was felt to be loose and so removed, and no pedicle screw construct was placed on the left-hand side of the spine.  CONDITION:  Stable.  FIRST ASSISTANT:  Erica Hudson, P.A.  HISTORY:  This is a very pleasant woman who has been having significant back, buttock, and radicular left leg pain.  Attempts at conservative management have failed to alleviate her symptoms.  As a result, we elected to proceed with surgery.  All appropriate risks, benefits, and alternatives to surgery were discussed and consent was obtained.  OPERATIVE NOTE:  The patient  was brought to the operating room, placed supine on the operating table.  After successful induction of general anesthesia and endotracheal intubation, TEDs, SCDs, and a Foley were inserted.  Needles were placed for intraoperative EMG neuro monitoring. She was turned prone onto the Wilson frame and all bony prominences were well padded and the back was prepped and draped in a standard fashion. Time-out was taken confirming patient, procedure, and all other pertinent important data.  Using AP and lateral fluoroscopy, I identified the lateral border of the L4 and L5 pedicles on the right side.  Small incisions were made and then a Jamshidi needle was advanced down to the lateral aspect of the pedicle.  I then advanced the Jamshidi through the pedicle and into the vertebral body.  I again stimulated Jamshidi throughout this procedure and there was no evidence of breach.  I then cannulated both the L4 and 5 pedicles and then tapped over the guide pin.  Screws were placed. Both screws had excellent purchase.  There was no loosening or issue. At this point, I then went to the left-hand side.  Using a Wiltse type approach, approximately 2-1/2 fingerbreadth to the left of midline, I made an incision spanning from the inferior aspect of L4 to the inferior aspect of L5.  Sharp dissection was carried out down to the deep fascia.  I incised the deep fascia and bluntly dissected through the paraspinal muscles to expose the posterolateral aspect of the spine.  Once this was done, I could visualize the L5 and the L4 lamina as well as the 4-5, 5- 1, and 3-4 facet capsules.  Jamshidi needle was placed on the lateral aspect of the L3-4 facet complex and advanced into the L4 pedicle and into the L4 vertebral body.  The trajectory was confirmed with AP and lateral fluoroscopy, and there was no neurodiagnostic evidence of breach.  I then tapped, I then placed a screw, which had apparently good fixation.  I  then placed a screw at L5 in a similar fashion.  I distracted the space and held it open with a retractor and again there was no issue with the pedicle screw.  I then began my laminotomy of L4. I removed the facet capsule of L4-5 and then using an osteotome removed the entire inferior L4 facet.  I then used a 2 and 3 mm Kerrison to perform a generous laminotomy and resect the remaining portion of the pars of L4.  I then used a fine nerve hook to release the ligamentum flavum from the leading edge of the L5 lamina and then removed the ligamentum flavum.  I then removed the overhanging osteophyte from the superior L5 facet complex to adequately decompress the lateral recess. I could now visualize the lateral aspect of the thecal sac, the L4 and L5 nerve roots.  The L4 nerve root was completely visible now that the pars was resected.  I could palpate the inferior and medial aspect of the pedicle of L4.  There was no evidence of breach and I could palpate and visualize the superior and medial aspect of the L5 pedicle and there was no breach.  I then isolated the L4-5 disk space and performed annulotomy with a 15-blade scalpel.  I then removed all the disk material at L4-5 using pituitary rongeurs, curettes, and Kerrison rongeurs.  Once I had done this, I rasped the endplates so I made sure I had bleeding subchondral bone.  I then used a Titan titanium intervertebral cage, size 11 extra long.  This was packed with the local bone that I had harvested from the decompression along with DBX mix.  I placed a sheet of Conform allograft bone along the anterior annulus and then malleted the implant to the appropriate depth.  I had excellent purchase, it was well centered.  I then turned my attention to the L5 lamina.  Again using the same technique I had used at L4, I performed a generous laminotomy of L5.  I released the ligamentum flavum and then removed the overhanging osteophyte in the lateral  recess.  I then palpated and visualized the disk space and there was no significant disk herniation.  I could now palpate and visualize the S1 nerve root as it traveled into the foramen, and it was no longer under any undue tension. I could also palpate the 5 nerve root as it was entering into the foramen and again it was completely free of any tension.  At this point, I had adequate decompression in the lateral recess of L5-S1.  I then obtained hemostasis using bipolar electrocautery.  I then connected my rod screw device on the right-hand side and torqued it down.  I did place a bone graft in the lateral gutter on this side after disrupting the facet complex.  On the left-hand side, while I was placing the screw  rod construct, I felt as though the left L4 pedicle screw was loose.  I gently pulled down with a Kocher and in fact it was, and it came right out.  I then palpated the pedicle hole with a pedicle finder and it was noted to be intact.  There was no evidence of any breach.  I then re-tapped and then placed a 7.5 diameter screw.  Again this one seemed to have better purchase, but ultimately after I started locking the nuts down, it also felt loose.  At this point, I elected to remove the pedicle screw construct on this right side for fear that I would cause more damage.  I did put bone graft in the pedicle and then irrigated copiously with normal saline.  Thrombin-soaked Gelfoam patty was placed over the exposed thecal sac.  At this point, final x-rays were satisfactory, although I had unilateral pedicle screw construct, I had good interbody fixation, I felt this to be a stable construct.  At this point, I closed the wound once I had hemostasis using bipolar electrocautery.  Closed the wound with interrupted #1 Vicryl sutures, then 2-0 Vicryl sutures, then 3-0 Monocryl.  Similar closure through the MIS side on the right side.  Steri-Strips and dry dressing were applied  and ultimately the patient was extubated and transferred to the PACU without incident.  At the end of the case, all needle and sponge counts were correct.  There were no adverse intraoperative events.     Erica Beal, MD     DDB/MEDQ  D:  01/28/2014  T:  01/28/2014  Job:  [id number]

## 2014-01-28 NOTE — Transfer of Care (Signed)
Immediate Anesthesia Transfer of Care Note  Patient: Erica GroutMicaela Hudson  Procedure(s) Performed: Procedure(s): POSTERIOR LUMBAR FUSION 1 LEVEL L4-L5   (TLIFT)  LEFT L5-S1 DECOMPRESSION  (Left)  Patient Location: PACU  Anesthesia Type:General  Level of Consciousness: awake, alert , oriented and patient cooperative  Airway & Oxygen Therapy: Patient Spontanous Breathing and Patient connected to nasal cannula oxygen  Post-op Assessment: Report given to PACU RN, Post -op Vital signs reviewed and stable and Patient moving all extremities  Post vital signs: Reviewed and stable  Complications: No apparent anesthesia complications

## 2014-01-28 NOTE — Progress Notes (Signed)
Via interpreter denies numbness or tingling in lower extremities

## 2014-01-29 ENCOUNTER — Inpatient Hospital Stay (HOSPITAL_COMMUNITY): Payer: Worker's Compensation

## 2014-01-29 LAB — GLUCOSE, CAPILLARY
GLUCOSE-CAPILLARY: 157 mg/dL — AB (ref 70–99)
GLUCOSE-CAPILLARY: 138 mg/dL — AB (ref 70–99)
GLUCOSE-CAPILLARY: 184 mg/dL — AB (ref 70–99)
Glucose-Capillary: 162 mg/dL — ABNORMAL HIGH (ref 70–99)

## 2014-01-29 MED ORDER — ONDANSETRON 4 MG PO TBDP: 4.0000 mg | ORAL_TABLET | Freq: Three times a day (TID) | ORAL | Status: AC | PRN

## 2014-01-29 MED ORDER — METHOCARBAMOL 500 MG PO TABS: 500.0000 mg | ORAL_TABLET | Freq: Four times a day (QID) | ORAL | Status: AC | PRN

## 2014-01-29 MED ORDER — OXYCODONE-ACETAMINOPHEN 10-325 MG PO TABS: 1.0000 | ORAL_TABLET | Freq: Four times a day (QID) | ORAL | Status: AC | PRN

## 2014-01-29 MED ORDER — POLYETHYLENE GLYCOL 3350 17 G PO PACK: 17.0000 g | PACK | Freq: Every day | ORAL | Status: AC

## 2014-01-29 MED ORDER — DOCUSATE SODIUM 100 MG PO CAPS: 100.0000 mg | ORAL_CAPSULE | Freq: Two times a day (BID) | ORAL | Status: AC

## 2014-01-29 NOTE — Evaluation (Signed)
Physical Therapy Evaluation Patient Details Name: Erica GroutMicaela Hudson MRN: [id number] DOB: 03-03-1951 Today's Date: 01/29/2014   History of Present Illness  pt is a 63 y.o. female s/p POSTERIOR LUMBAR FUSION 1 LEVEL L4-L5   (TLIFT)  LEFT L5-S1 DECOMPRESSION.  Clinical Impression  Patient is s/p above surgery resulting in the deficits listed below (see PT Problem List). Patient will benefit from skilled PT to increase their independence and safety with mobility (while adhering to their precautions) to allow discharge home with family. Recommending HHPT at this time due to decr safety awareness with AD and precautions. Pt with interpreter through work/insurance; Erica LimingMarin Hudson, [employer] interpreting and translation services. Given handouts in spanish for education regarding back surgery.     Follow Up Recommendations Home health PT;Supervision/Assistance - 24 hour    Equipment Recommendations  Rolling walker with 5" wheels    Recommendations for Other Services OT consult     Precautions / Restrictions Precautions Precautions: Back;Fall Precaution Comments: educated on back precautions Required Braces or Orthoses: Spinal Brace Spinal Brace: Lumbar corset;Applied in sitting position Restrictions Weight Bearing Restrictions: No      Mobility  Bed Mobility Overal bed mobility: Needs Assistance Bed Mobility: Rolling;Sidelying to Sit Rolling: Min guard Sidelying to sit: Min assist       General bed mobility comments: cues for log rolling technique and min (A) to elevate trunk to sitting position; limited by pain   Transfers Overall transfer level: Needs assistance   Transfers: Sit to/from Stand Sit to Stand: Min guard         General transfer comment: min guard to steady; cues for hand placement and technique with back precautions   Ambulation/Gait Ambulation/Gait assistance: Min assist Ambulation Distance (Feet): 12 Feet Assistive device: Rolling walker (2 wheeled);1  person hand held assist Gait Pattern/deviations: Step-through pattern;Decreased stride length;Shuffle;Staggering left Gait velocity: very decr Gait velocity interpretation: <1.8 ft/sec, indicative of risk for recurrent falls General Gait Details: initially ambulating with RW; attempted ambulating without RW; pt unsteady; more stable with RW; cues for safety and back precautions   Stairs            Wheelchair Mobility    Modified Rankin (Stroke Patients Only)       Balance Overall balance assessment: Needs assistance Sitting-balance support: Feet supported;No upper extremity supported Sitting balance-Leahy Scale: Poor Sitting balance - Comments: guarded due to pain   Standing balance support: During functional activity;Bilateral upper extremity supported Standing balance-Leahy Scale: Poor Standing balance comment: required UE support and (A) to balance                              Pertinent Vitals/Pain Pain Assessment: 0-10 Pain Score: 8  Pain Location: Lt side of surgical incision Pain Descriptors / Indicators: Stabbing Pain Intervention(s): Premedicated before session;Repositioned;Patient requesting pain meds-RN notified    Home Living Family/patient expects to be discharged to:: Private residence Living Arrangements: Children Available Help at Discharge: Family;Available 24 hours/day Type of Home: Mobile home Home Access: Stairs to enter Entrance Stairs-Rails: Can reach both;Left;Right Entrance Stairs-Number of Steps: 3 Home Layout: One level Home Equipment: None Additional Comments: pt has walk in shower    Prior Function Level of Independence: Independent               Hand Dominance        Extremity/Trunk Assessment   Upper Extremity Assessment: Defer to OT evaluation  Lower Extremity Assessment: Generalized weakness      Cervical / Trunk Assessment: Normal  Communication   Communication: Prefers language other  than English  Cognition Arousal/Alertness: Awake/alert Behavior During Therapy: WFL for tasks assessed/performed Overall Cognitive Status: Within Functional Limits for tasks assessed                      General Comments General comments (skin integrity, edema, etc.): educated on precautions and safety with wearing back brace     Exercises General Exercises - Lower Extremity Ankle Circles/Pumps: AROM;Both;10 reps;Supine      Assessment/Plan    PT Assessment Patient needs continued PT services  PT Diagnosis Difficulty walking;Generalized weakness;Acute pain   PT Problem List Decreased strength;Decreased activity tolerance;Decreased balance;Decreased mobility;Decreased knowledge of use of DME;Decreased safety awareness;Decreased knowledge of precautions;Pain  PT Treatment Interventions DME instruction;Gait training;Stair training;Functional mobility training;Therapeutic activities;Therapeutic exercise;Neuromuscular re-education;Balance training;Patient/family education   PT Goals (Current goals can be found in the Care Plan section) Acute Rehab PT Goals Patient Stated Goal: to go home tomorrow PT Goal Formulation: With patient Time For Goal Achievement: 02/05/14 Potential to Achieve Goals: Good    Frequency Min 5X/week   Barriers to discharge        Co-evaluation               End of Session Equipment Utilized During Treatment: Gait belt;Back brace Activity Tolerance: Patient tolerated treatment well Patient left: in chair;with call bell/phone within reach Nurse Communication: Mobility status;Precautions;Patient requests pain meds         Time: 0920-0946 PT Time Calculation (min): 26 min   Charges:   PT Evaluation $Initial PT Evaluation Tier I: 1 Procedure PT Treatments $Gait Training: 8-22 mins   PT G CodesDonell Hudson:          Erica Hudson N, [place]PT  [phone] 01/29/2014, 10:47 AM

## 2014-01-29 NOTE — Progress Notes (Signed)
    Subjective: Procedure(s) (LRB): POSTERIOR LUMBAR FUSION 1 LEVEL L4-L5   (TLIFT)  LEFT L5-S1 DECOMPRESSION  (Left) 1 Day Post-Op  Patient reports pain as 4 on 0-10 scale.  Reports decreased leg pain reports incisional back pain   Positive void Negative bowel movement Positive flatus Negative chest pain or shortness of breath  Objective: Vital signs in last 24 hours: Temp:  [97.8 F (36.6 C)-98.5 F (36.9 C)] 98 F (36.7 C) (10/30 0520) Pulse Rate:  [59-79] 67 (10/30 0520) Resp:  [6-18] 16 (10/30 0520) BP: (97-148)/(50-78) 103/58 mmHg (10/30 0520) SpO2:  [92 %-99 %] 95 % (10/30 0520) Weight:  [58.514 kg (129 lb)] 58.514 kg (129 lb) (10/29 1025)  Intake/Output from previous day: 10/29 0701 - 10/30 0700 In: 4485 [P.O.:690; I.V.:3395; IV Piggyback:400] Out: 3375 [Urine:3075; Blood:300]  Labs: No results found for this basename: WBC, RBC, HCT, PLT,  in the last 72 hours No results found for this basename: NA, K, CL, CO2, BUN, CREATININE, GLUCOSE, CALCIUM,  in the last 72 hours  Recent Labs  01/28/14 1028  INR 1.01    Physical Exam: Neurologically intact ABD soft Neurovascular intact Incision: dressing C/D/I and no drainage Compartment soft  Assessment/Plan: Patient stable  xrays pending Continue mobilization with physical therapy Continue care  Advance diet Up with therapy D/C IV fluids Doing well Possible d/c Saturday  Venita Lickahari Khaden Gater, MD [facility] [phone];u

## 2014-01-29 NOTE — Care Management Note (Signed)
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 Patient had a L4-L5 TLIF, L4-5 decompression.     Action/Plan:   Case manager spoke with patient and daughter concerning home health and DME needs. patient is under worker's comp.   Anticipated DC Date:  01/30/2014   Anticipated DC Plan:  HOME W HOME HEALTH SERVICES      Comments:  01/29/14  1700 Vance PeperSusan  Wendal Wilkie, RN BSN Case Manager CM received call from VictoriaAdrianna with [facility] stating that [facility] will not be providing Home Health. Someone will be contacting patient/CM with assigned home health agency.

## 2014-01-29 NOTE — Evaluation (Signed)
Occupational Therapy Evaluation Patient Details Name: Erica GroutMicaela Hudson MRN: [id number] DOB: 04-21-1950 Today's Date: 01/29/2014    History of Present Illness pt is a 63 y.o. female s/p POSTERIOR LUMBAR FUSION 1 LEVEL L4-L5   (TLIFT)  LEFT L5-S1 DECOMPRESSION.   Clinical Impression   Pt admitted with the above diagnoses and presents with below problem list. Pt will benefit from continued acute OT to address the below listed deficits and maximize independence with basic ADLs prior to d/c home. PTA pt was independent with ADLs. Currently pt is at min A level for bed mobility and min guard level for LB ADLs using AE.  ADL education provided.     Follow Up Recommendations  Supervision/Assistance - 24 hour;No OT follow up    Equipment Recommendations  3 in 1 bedside comode;Other (comment) (reacher, sock aid, tongs, long-handled sponge)    Recommendations for Other Services       Precautions / Restrictions Precautions Precautions: Back;Fall Precaution Comments: pt able to state BAT precautions; reviewed functional impact Required Braces or Orthoses: Spinal Brace Spinal Brace: Lumbar corset;Applied in sitting position Restrictions Weight Bearing Restrictions: No      Mobility Bed Mobility Overal bed mobility: Needs Assistance Bed Mobility: Rolling;Sit to Sidelying Rolling: Min guard      Sit to sidelying: Min assist General bed mobility comments: min A to maintain position; cueing for technique  Transfers Overall transfer level: Needs assistance Equipment used: Rolling walker (2 wheeled) Transfers: Sit to/from Stand Sit to Stand: Min guard         General transfer comment: cues for technique and management of rw    Balance Overall balance assessment: Needs assistance Sitting-balance support: Feet supported;No upper extremity supported Sitting balance-Leahy Scale: Poor Sitting balance - Comments: guarded due to pain   Standing balance support: Bilateral upper  extremity supported;During functional activity Standing balance-Leahy Scale: Poor Standing balance comment: required UE support and (A) to balance                             ADL Overall ADL's : Needs assistance/impaired Eating/Feeding: Set up;Sitting   Grooming: Set up;Standing;Sitting   Upper Body Bathing: Min guard;Sitting   Lower Body Bathing: Min guard;With adaptive equipment;Sit to/from stand   Upper Body Dressing : Sitting;Min guard   Lower Body Dressing: Min guard;Sit to/from stand;With adaptive equipment   Toilet Transfer: Min guard;Ambulation;RW (3n1 over rw)   Toileting- Clothing Manipulation and Hygiene: Min guard;Sit to/from stand   Tub/ Shower Transfer: Min guard;Ambulation;3 in 1;Rolling walker   Functional mobility during ADLs: Min guard;Rolling walker General ADL Comments: Educated pt on techniques and AE for safe completion of ADLs with back precautions. Discussed home setup safety (3n1 over toilet and in shower, removal of throw rugs, etc.)     Vision                     Perception     Praxis      Pertinent Vitals/Pain Pain Assessment: 0-10 Pain Score: 7  Pain Location: constant back pain; intermittent pain Lt of incision "jabbing" during transfers  Pain Descriptors / Indicators: Stabbing Pain Intervention(s): Limited activity within patient's tolerance;Monitored during session;Repositioned     Hand Dominance     Extremity/Trunk Assessment Upper Extremity Assessment Upper Extremity Assessment: Overall WFL for tasks assessed   Lower Extremity Assessment Lower Extremity Assessment: Defer to PT evaluation   Cervical / Trunk Assessment Cervical / Trunk Assessment: Normal  Communication Communication Communication: Prefers language other than English   Cognition Arousal/Alertness: Awake/alert Behavior During Therapy: WFL for tasks assessed/performed Overall Cognitive Status: Within Functional Limits for tasks assessed                      General Comments       Exercises   Shoulder Instructions      Home Living Family/patient expects to be discharged to:: Private residence Living Arrangements: Children Available Help at Discharge: Family;Available 24 hours/day Type of Home: Mobile home Home Access: Stairs to enter Entrance Stairs-Number of Steps: 3 Entrance Stairs-Rails: Can reach both;Left;Right Home Layout: One level     Bathroom Shower/Tub: Producer, television/film/videoWalk-in shower   Bathroom Toilet: Standard     Home Equipment: None   Additional Comments: pt has walk in shower      Prior Functioning/Environment Level of Independence: Independent             OT Diagnosis: Acute pain   OT Problem List: Impaired balance (sitting and/or standing);Decreased knowledge of use of DME or AE;Decreased knowledge of precautions;Pain   OT Treatment/Interventions: Self-care/ADL training;DME and/or AE instruction;Therapeutic activities;Patient/family education;Balance training    OT Goals(Current goals can be found in the care plan section) Acute Rehab OT Goals Patient Stated Goal: not stated OT Goal Formulation: With patient Time For Goal Achievement: 02/05/14 Potential to Achieve Goals: Good ADL Goals Pt Will Perform Lower Body Bathing: with modified independence;with adaptive equipment;sit to/from stand Pt Will Perform Lower Body Dressing: with modified independence;with adaptive equipment;sit to/from stand Pt Will Perform Tub/Shower Transfer: with supervision;ambulating;3 in 1;rolling walker Additional ADL Goal #1: Pt will complete bed mobility tasks at mod I level to prepare for OOB ADLS.  OT Frequency: Min 3X/week   Barriers to D/C:            Co-evaluation              End of Session Equipment Utilized During Treatment: Rolling walker;Back brace  Activity Tolerance: Patient tolerated treatment well;Patient limited by pain Patient left: in bed;with call bell/phone within reach;with  family/visitor present   Time: [id number] OT Time Calculation (min): 26 min Charges:  OT General Charges $OT Visit: 1 Procedure OT Evaluation $Initial OT Evaluation Tier I: 1 Procedure OT Treatments $Self Care/Home Management : 8-22 mins G-Codes:    Pilar GrammesMathews, Jakaria Lavergne H 01/29/2014, 12:30 PM

## 2014-01-29 NOTE — Progress Notes (Signed)
Utilization review completed.  

## 2014-01-29 NOTE — Discharge Instructions (Signed)
Patient needs to inform her pharmacy that she is covered by [employer]. Pharmacist is to call [facility] : [phone] to authorize medications.

## 2014-01-29 NOTE — Care Management Note (Signed)
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 Patient had a L4-L5 TLIF, L4-5 decompression.     Action/Plan:   Case manager spoke with patient and daughter concerning home health and DME needs. patient is under worker's comp.   Anticipated DC Date:  01/30/2014   Anticipated DC Plan:  HOME W HOME HEALTH SERVICES      DC Planning Services  CM consult      PAC Choice  HOME HEALTH  DURABLE MEDICAL EQUIPMENT   Choice offered to / List presented to:     DME arranged  3-N-1  [facility]      DME agency  OTHER - SEE NOTE     HH arranged  HH-2 PT      HH agency  OTHER - SEE NOTE   Status of service:  In process, will continue to follow Medicare Important Message given?   (If response is "NO", the following Medicare IM given date fields will be blank) Date Medicare IM given:   Medicare IM given by:   Date Additional Medicare IM given:   Additional Medicare IM given by:    Discharge Disposition:  HOME W HOME HEALTH SERVICES  Per UR Regulation:  Reviewed for med. necessity/level of care/duration of stay  If discussed at Long Length of Stay Meetings, dates discussed:    Comments:  01/29/14 Patient's worker's comp adjuster is Tanna Discher with [employer], [phone] claim #[id number]. Telephonic case manager is [phone], she will be unavailable until Monday, Nov.2, 2015. CM spoke with Cathy Abel who was covering [phone]. This writer faxed orders for DME and home health to : [facility] [phone], ph.[phone]. Per Cathy they are going to authorize [facility] to provide home health and DME for patient. Patient will need Transportation home, [facility] is provider: [phone]. Patient will need to inform her pharmacy that she is under worker's comp and  they have to call [facility] [phone]. This information will be placed on the patient's discharge instruction.   

## 2014-01-30 LAB — URINALYSIS, ROUTINE W REFLEX MICROSCOPIC
Bilirubin Urine: NEGATIVE
Glucose, UA: >1000 mg/dL — AB
Ketones, ur: NEGATIVE mg/dL
LEUKOCYTES UA: NEGATIVE
Nitrite: NEGATIVE
PROTEIN: NEGATIVE mg/dL
Specific Gravity, Urine: 1.023 (ref 1.005–1.030)
Urobilinogen, UA: 0.2 mg/dL (ref 0.0–1.0)
pH: 5.5 (ref 5.0–8.0)

## 2014-01-30 LAB — URINE MICROSCOPIC-ADD ON

## 2014-01-30 LAB — GLUCOSE, CAPILLARY
GLUCOSE-CAPILLARY: 235 mg/dL — AB (ref 70–99)
Glucose-Capillary: 252 mg/dL — ABNORMAL HIGH (ref 70–99)
Glucose-Capillary: 166 mg/dL — ABNORMAL HIGH (ref 70–99)
Glucose-Capillary: 200 mg/dL — ABNORMAL HIGH (ref 70–99)

## 2014-01-30 MED ORDER — FLEET ENEMA 7-19 GM/118ML RE ENEM: 1.0000 | ENEMA | Freq: Once | RECTAL | Status: DC

## 2014-01-30 MED ORDER — MAGNESIUM CITRATE PO SOLN
1.0000 | Freq: Once | ORAL | Status: AC
  Administered 2014-01-30: 1 via ORAL
  Filled 2014-01-30: qty 296

## 2014-01-30 NOTE — Care Management Note (Signed)
CARE MANAGEMENT NOTE 01/30/2014  Comments:  01/30/14 0800 Vance PeperSusan Mann Skaggs, RN BSN Case manager received message from worker's comp, Home health has not been arranged and will not until Monday, Nov.3,2015, Dr. Shon BatonBrooks notified. He says patient will discharge on Sunday, Nov.1,2015, no problem with HH .

## 2014-01-30 NOTE — Care Management Note (Signed)
CARE MANAGEMENT NOTE 01/30/2014  Comments:  01/30/14 0930 Frann Rider, RN, BSN  Met with pt and family. D/C plans is to return home with the support of her family. Family is requesting a shower bench. Will contact Worker's Comp for approval for shower bench. Explained to them that Worker's Comp has not been able to arrange [place]. Will contact [place] for DME. Family is requesting transportation through [employer]

## 2014-01-30 NOTE — Progress Notes (Signed)
Physical Therapy Treatment Patient Details Name: Erica GroutMicaela Degraaf MRN: [id number] DOB: 05-May-1950 Today's Date: 01/30/2014    History of Present Illness pt is a 63 y.o. female s/p POSTERIOR LUMBAR FUSION 1 LEVEL L4-L5   (TLIFT)  LEFT L5-S1 DECOMPRESSION.    PT Comments    Patient progressing well towards physical therapy goals, ambulating up to 520 feet with a rolling walker at a supervision level for safety. Reviewed back precautions and safety with mobility including log roll technique. Patient will continue to benefit from skilled physical therapy services to further improve independence with functional mobility.   Follow Up Recommendations  No PT follow up     Equipment Recommendations  Rolling walker with 5" wheels    Recommendations for Other Services       Precautions / Restrictions Precautions Precautions: Back;Fall Precaution Comments: Reviewed back preacautions Required Braces or Orthoses: Spinal Brace Spinal Brace: Lumbar corset;Applied in sitting position Restrictions Weight Bearing Restrictions: No    Mobility  Bed Mobility Overal bed mobility: Needs Assistance Bed Mobility: Rolling;Sit to Sidelying;Sidelying to Sit Rolling: Supervision Sidelying to sit: Supervision     Sit to sidelying: Supervision General bed mobility comments: Supervision for safety. Educated on log roll technique. Practiced several times with patient until consistently maintaining back precautions. Pt verbalizes understanding and can perform this task without physical assist.  Transfers Overall transfer level: Needs assistance Equipment used: Rolling walker (2 wheeled) Transfers: Sit to/from Stand Sit to Stand: Supervision         General transfer comment: Supervision for safety. VC for hand placement on stable surface to rise. Performed from bed and recliner.  Ambulation/Gait Ambulation/Gait assistance: Supervision Ambulation Distance (Feet): 520 Feet Assistive device:  Rolling walker (2 wheeled) Gait Pattern/deviations: Step-through pattern;Decreased stride length   Gait velocity interpretation: Below normal speed for age/gender General Gait Details: VC for walker placement closer to base of support and to maintain back precautions during ambulatory bout. No loss of balance or instances of LE buckling.   Stairs            Wheelchair Mobility    Modified Rankin (Stroke Patients Only)       Balance                                    Cognition Arousal/Alertness: Awake/alert Behavior During Therapy: WFL for tasks assessed/performed Overall Cognitive Status: Within Functional Limits for tasks assessed                      Exercises      General Comments General comments (skin integrity, edema, etc.): Interpreted via family members and Charity fundraiserN.      Pertinent Vitals/Pain Pain Assessment: 0-10 Pain Score: 5  Pain Location: Back and right leg Pain Descriptors / Indicators:  ("pinching") Pain Intervention(s): Monitored during session;Repositioned    Home Living                      Prior Function            PT Goals (current goals can now be found in the care plan section) Acute Rehab PT Goals PT Goal Formulation: With patient Time For Goal Achievement: 02/05/14 Potential to Achieve Goals: Good Progress towards PT goals: Progressing toward goals    Frequency  Min 5X/week    PT Plan Discharge plan needs to be updated    Co-evaluation  End of Session Equipment Utilized During Treatment: Gait belt;Back brace Activity Tolerance: Patient tolerated treatment well Patient left: in chair;with call bell/phone within reach;with family/visitor present     Time: [id number] PT Time Calculation (min): 24 min  Charges:  $Gait Training: 8-22 mins $Therapeutic Activity: 8-22 mins                    G Codes:      BJ's WholesaleLogan Hudson Andrey Hoobler, [place]PT [phone]  Berton MountBarbour, Anneke Cundy S 01/30/2014, 9:57  AM

## 2014-01-30 NOTE — Progress Notes (Signed)
    Subjective: Procedure(s) (LRB): POSTERIOR LUMBAR FUSION 1 LEVEL L4-L5   (TLIFT)  LEFT L5-S1 DECOMPRESSION  (Left) 2 Days Post-Op  Patient reports pain as 4 on 0-10 scale.  Reports decreased leg pain reports incisional back pain   Positive void Negative bowel movement Positive flatus Negative chest pain or shortness of breath  Objective: Vital signs in last 24 hours: Temp:  [97.4 F (36.3 C)-98.9 F (37.2 C)] 98 F (36.7 C) (10/31 0554) Pulse Rate:  [60-70] 60 (10/31 0554) Resp:  [16] 16 (10/31 0554) BP: (91-103)/(46-55) 91/46 mmHg (10/31 0554) SpO2:  [96 %-97 %] 96 % (10/31 0554)  Intake/Output from previous day: 10/30 0701 - 10/31 0700 In: 1230 [P.O.:1230] Out: -   Labs: No results found for this basename: WBC, RBC, HCT, PLT,  in the last 72 hours No results found for this basename: NA, K, CL, CO2, BUN, CREATININE, GLUCOSE, CALCIUM,  in the last 72 hours  Recent Labs  01/28/14 1028  INR 1.01    Physical Exam: Neurologically intact ABD soft Intact pulses distally Incision: dressing C/D/I Compartment soft some discomfort with urination  Assessment/Plan: Patient stable  xrays satisfactory.  Excellent reduction of slip Continue mobilization with physical therapy Continue care  Advance diet Up with therapy D/C IV fluids Plan for discharge tomorrow Mag citrate/enema for constipation Will send UA to r/o UTI  Venita Lickahari Keiran Gaffey, MD [facility] [phone]

## 2014-01-30 NOTE — Progress Notes (Signed)
Occupational Therapy Treatment Patient Details Name: Erica Hudson MRN: [id number] DOB: Sep 29, 1950 Today's Date: 01/30/2014    History of present illness pt is a 63 y.o. female s/p POSTERIOR LUMBAR FUSION 1 LEVEL L4-L5   (TLIFT)  LEFT L5-S1 DECOMPRESSION.   OT comments  Pt seen with family members participating.  Educated at length in back precautions related to ADL and IADL, use of AE and DME.  Pt requiring supervision to min guard assist for ADL tranfers.  Follow Up Recommendations  Supervision/Assistance - 24 hour;No OT follow up    Equipment Recommendations  3 in 1 bedside comode;Other (comment) (adaptive equipment kit)    Recommendations for Other Services      Precautions / Restrictions Precautions Precautions: Back;Fall Precaution Booklet Issued: Yes (comment) Precaution Comments: Reviewed back preacautions (grandson speak English, requested back handout) Required Braces or Orthoses: Spinal Brace Spinal Brace: Lumbar corset;Applied in sitting position Restrictions Weight Bearing Restrictions: No       Mobility Bed Mobility  General bed mobility comments: pt up in chair  Transfers Overall transfer level: Needs assistance Equipment used: Rolling walker (2 wheeled) Transfers: Sit to/from Stand Sit to Stand: Supervision         General transfer comment: Supervision for safety. VC for hand placement on stable surface to rise. Performed recliner.    Balance                                   ADL Overall ADL's : Needs assistance/impaired     Grooming: Wash/dry hands;Standing;Supervision/safety         Lower Body Bathing Details (indicate cue type and reason): instructed pt and family in use of long sponge     Lower Body Dressing: Supervision/safety;Sit to/from stand;With adaptive equipment Lower Body Dressing Details (indicate cue type and reason): practiced use of sock aide, reacher. Recommended slip on shoes. Toilet Transfer:  Supervision/safety;Comfort height toilet;Grab bars;RW   Toileting- Water quality scientist and Hygiene: Supervision/safety;Sit to/from stand Toileting - Clothing Manipulation Details (indicate cue type and reason): Instructed in use of toilet aide for pericare after BM. Able to manage after urinating. Tub/ Shower Transfer: Min guard;Cueing for safety;Adhering to back precautions;Tub bench;Ambulation Tub/Shower Transfer Details (indicate cue type and reason): family requested a tub bench per case management note, pt and family shown bench and technique avoiding twisting Functional mobility during ADLs: Supervision/safety;Rolling walker General ADL Comments: Educated pt and family in multiple uses of 3 in 1.        Vision                     Perception     Praxis      Cognition   Behavior During Therapy: WFL for tasks assessed/performed Overall Cognitive Status: Within Functional Limits for tasks assessed                       Extremity/Trunk Assessment               Exercises     Shoulder Instructions       General Comments      Pertinent Vitals/ Pain       Pain Assessment: 0-10 Pain Score: 6  Pain Location: back Pain Descriptors / Indicators: Aching Pain Intervention(s): Monitored during session;Repositioned;RN gave pain meds during session  Home Living  Prior Functioning/Environment              Frequency Min 3X/week     Progress Toward Goals  OT Goals(current goals can now be found in the care plan section)  Progress towards OT goals: Progressing toward goals  Acute Rehab OT Goals Patient Stated Goal: not stated  Plan Discharge plan remains appropriate    Co-evaluation                 End of Session Equipment Utilized During Treatment: Rolling walker;Back brace   Activity Tolerance Patient tolerated treatment well   Patient Left in chair;with call bell/phone  within reach;with family/visitor present   Nurse Communication Patient requests pain meds        Time: 1019-1057 OT Time Calculation (min): 38 min  Charges: OT General Charges $OT Visit: 1 Procedure OT Treatments $Self Care/Home Management : 38-52 mins  Malka So 01/30/2014, 11:09 AM [phone]

## 2014-01-31 LAB — GLUCOSE, CAPILLARY: Glucose-Capillary: 187 mg/dL — ABNORMAL HIGH (ref 70–99)

## 2014-01-31 NOTE — Progress Notes (Signed)
Physical Therapy Treatment Patient Details Name: Erica GroutMicaela Hudson MRN: [id number] DOB: Mar 20, 1951 Today's Date: 01/31/2014    History of Present Illness pt is a 63 y.o. female s/p POSTERIOR LUMBAR FUSION 1 LEVEL L4-L5   (TLIFT)  LEFT L5-S1 DECOMPRESSION.    PT Comments    Continuing to mobilize well and able to verbalize and adhere to back precautions with mobility; OK for dc home from PT standpoint   Follow Up Recommendations  Outpatient PT  The potential need for Outpatient PT can be addressed at Ortho follow-up appointments.      Equipment Recommendations  Rolling walker with 5" wheels;3in1 (PT) (shower chair/tub bench)    Recommendations for Other Services       Precautions / Restrictions Precautions Precautions: Back;Fall Precaution Comments: Reviewed back preacautions Required Braces or Orthoses: Spinal Brace Spinal Brace: Lumbar corset;Applied in sitting position Restrictions Weight Bearing Restrictions: No    Mobility  Bed Mobility Overal bed mobility: Needs Assistance Bed Mobility: Rolling;Sidelying to Sit Rolling: Supervision Sidelying to sit: Supervision       General bed mobility comments: Supervision for safety. Reviewed log roll technique. Pt verbalizes understanding and can perform this task without physical assist.  Transfers Overall transfer level: Needs assistance Equipment used: Rolling walker (2 wheeled) Transfers: Sit to/from Stand Sit to Stand: Supervision         General transfer comment: Supervision for safety. VC for hand placement on stable surface to rise. Performed from bed  Ambulation/Gait Ambulation/Gait assistance: Supervision;Modified independent (Device/Increase time) Ambulation Distance (Feet): 100 Feet Assistive device: Rolling walker (2 wheeled) Gait Pattern/deviations: Step-through pattern     General Gait Details: VC for walker placement closer to base of support and to maintain back precautions during ambulatory bout.  No loss of balance or instances of LE buckling.Cues to relax shoulders   Stairs Stairs: Yes Stairs assistance: Supervision Stair Management: One rail Right;One rail Left;Forwards;Step to pattern Number of Stairs: 5 General stair comments: Cues for sequence; managing well  Wheelchair Mobility    Modified Rankin (Stroke Patients Only)       Balance                                    Cognition Arousal/Alertness: Awake/alert Behavior During Therapy: WFL for tasks assessed/performed Overall Cognitive Status: Within Functional Limits for tasks assessed                      Exercises      General Comments General comments (skin integrity, edema, etc.): Daughter assisted with communication; Provided pt with adaptive equipment      Pertinent Vitals/Pain Pain Assessment: 0-10 Pain Score: 6  Pain Location: back Pain Descriptors / Indicators: Aching Pain Intervention(s): Monitored during session    Home Living                      Prior Function            PT Goals (current goals can now be found in the care plan section) Acute Rehab PT Goals Patient Stated Goal: hopeful for home PT Goal Formulation: With patient Time For Goal Achievement: 02/05/14 Potential to Achieve Goals: Good Progress towards PT goals: Progressing toward goals    Frequency  Min 5X/week    PT Plan Discharge plan needs to be updated    Co-evaluation  End of Session Equipment Utilized During Treatment: Back brace Activity Tolerance: Patient tolerated treatment well Patient left: with call bell/phone within reach;with family/visitor present (walking in room)     Time: 1003-1030 PT Time Calculation (min): 27 min  Charges:  $Gait Training: 23-37 mins                    G Codes:      Van ClinesGarrigan, Erica Hudson 01/31/2014, 12:53 PM  Van ClinesHolly Latonja Hudson, [place]PT  Acute Rehabilitation Services Pager [phone] Office [phone]

## 2014-01-31 NOTE — Progress Notes (Signed)
Subjective: 3 Days Post-Op Procedure(s) (LRB): POSTERIOR LUMBAR FUSION 1 LEVEL L4-L5   (TLIFT)  LEFT L5-S1 DECOMPRESSION  (Left) Patient reports pain as mild.    Objective: Vital signs in last 24 hours: Temp:  [97.3 F (36.3 C)-98.3 F (36.8 C)] 97.9 F (36.6 C) (11/01 0452) Pulse Rate:  [57-68] 57 (11/01 0452) Resp:  [18] 18 (11/01 0452) BP: (101-110)/(50-82) 110/82 mmHg (11/01 0452) SpO2:  [95 %-100 %] 98 % (11/01 0452)  Intake/Output from previous day: 10/31 0701 - 11/01 0700 In: 840 [P.O.:840] Out: -  Intake/Output this shift:    No results for input(s): HGB in the last 72 hours. No results for input(s): WBC, RBC, HCT, PLT in the last 72 hours. No results for input(s): NA, K, CL, CO2, BUN, CREATININE, GLUCOSE, CALCIUM in the last 72 hours.  Recent Labs  01/28/14 1028  INR 1.01    PE:  wn wd woman in nad.  Gait is slightly halting with no antalgia.  Dressing c/d/i.  NVI at Bil[facility] LEs.  Assessment/Plan: 3 Days Post-Op Procedure(s) (LRB): POSTERIOR LUMBAR FUSION 1 LEVEL L4-L5   (TLIFT)  LEFT L5-S1 DECOMPRESSION  (Left) D/c home today.  Toni ArthursHEWITT, Keirra Zeimet 01/31/2014, 8:59 AM

## 2014-02-01 LAB — GLUCOSE, CAPILLARY: GLUCOSE-CAPILLARY: 167 mg/dL — AB (ref 70–99)

## 2014-02-02 NOTE — Anesthesia Postprocedure Evaluation (Signed)
  Anesthesia Post-op Note  Patient: Alessandra GroutMicaela Swango  Procedure(s) Performed: Procedure(s): POSTERIOR LUMBAR FUSION 1 LEVEL L4-L5   (TLIFT)  LEFT L5-S1 DECOMPRESSION  (Left)  Patient Location: PACU  Anesthesia Type:General  Level of Consciousness: awake, alert  and oriented  Airway and Oxygen Therapy: Patient Spontanous Breathing  Post-op Pain: none  Post-op Assessment: Post-op Vital signs reviewed  Post-op Vital Signs: Reviewed  Last Vitals:  Filed Vitals:   01/31/14 0452  BP: 110/82  Pulse: 57  Temp: 36.6 C  Resp: 18    Complications: No apparent anesthesia complications

## 2014-02-02 NOTE — Care Management Note (Signed)
CARE MANAGEMENT NOTE 01/29/2014  Patient: Lieber,Raevin Account Number: [id number]  Date Initiated: 01/29/2014 Documentation initiated by: Vance PeperBRADY,Sharniece Gibbon  Subjective/Objective Assessment:  63 yr old female admitted with forimal stenosis. Patient had a L4-L5 TLIF, L4-5 decompression.    Action/Plan:  Case manager spoke with patient and daughter concerning home health and DME needs. patient is under worker's comp.   Anticipated DC Date: 01/30/2014 Anticipated DC Plan: HOME W HOME HEALTH SERVICES    DC Planning Services  CM consult    [facility]  DURABLE MEDICAL EQUIPMENT   Choice offered to / List presented to:   DME arranged  3-N-1  [facility]    DME agency  OTHER - SEE NOTE    HH arranged  HH-2 PT    HH agency  OTHER - SEE NOTE   Status of service: In process, will continue to follow Medicare Important Message given?  (If response is "NO", the following Medicare IM given date fields will be blank) Date Medicare IM given:  Medicare IM given by:  Date Additional Medicare IM given:  Additional Medicare IM given by:   Discharge Disposition: HOME W HOME HEALTH SERVICES  Per UR Regulation: Reviewed for med. necessity/level of care/duration of stay  If discussed at Long Length of Stay Meetings, dates discussed:   Comments: 01/29/14 Patient's worker's comp Barrister's clerkadjuster is Educational psychologistTanna Discher with [employer], [phone] claim [phone]#[id number]. Telephonic case manager is [phone], she will be unavailable until Monday, Nov.2, 2015. CM spoke with Gracelyn NurseCathy Abel who was covering [phone]. This Clinical research associatewriter faxed orders for DME and home health to : [facility] [phone], [phone]. Per Lynden AngCathy they are going to authorize [facility] to provide home health and DME for patient. Patient will need Transportation home, [facility] is provider: [phone]. Patient will need to inform her pharmacy that she  is under worker's comp and they have to call [facility] [phone]. This information will be placed on the patient's discharge instruction.        Deleted by: Durenda GuthrieSusan Naomi Zoraida Havrilla, RN at 02/02/2014 11:24 AM  Chart Correction History

## 2014-02-09 NOTE — Discharge Summary (Signed)
Patient ID: Erica Hudson MRN: [id number] DOB/AGE: 61-Feb-1952 61 y.o.  Admit date: 01/28/2014 Discharge date: 02/09/2014  Admission Diagnoses:  Active Problems:   Back pain   Discharge Diagnoses:  Active Problems:   Back pain  status post Procedure(s): POSTERIOR LUMBAR FUSION 1 LEVEL L4-L5   (TLIFT)  LEFT L5-S1 DECOMPRESSION   Past Medical History  Diagnosis Date  . High cholesterol   . Diabetes mellitus without complication   . Hypertension   . Depression   . Anxiety   . Chronic kidney disease   . GERD (gastroesophageal reflux disease)   . Headache October 24, 2013    mirror hit her head, and headaches at times since this  . Arthritis   . Hepatitis     Surgeries: Procedure(s): POSTERIOR LUMBAR FUSION 1 LEVEL L4-L5   (TLIFT)  LEFT L5-S1 DECOMPRESSION  on 01/28/2014   Consultants:    Discharged Condition: Improved  Hospital Course: Erica Hudson is an 63 y.o. female who was admitted 01/28/2014 for operative treatment of lumbar stenosis. Patient failed conservative treatments (please see the history and physical for the specifics) and had severe unremitting pain that affects sleep, daily activities and work/hobbies. After pre-op clearance, the patient was taken to the operating room on 01/28/2014 and underwent  Procedure(s): POSTERIOR LUMBAR FUSION 1 LEVEL L4-L5   (TLIFT)  LEFT L5-S1 DECOMPRESSION .    Patient was given perioperative antibiotics:  Anti-infectives    Start     Dose/Rate Route Frequency Ordered Stop   01/28/14 1900  ceFAZolin (ANCEF) IVPB 1 g/50 mL premix     1 g100 mL/hr over 30 Minutes Intravenous Every 8 hours 01/28/14 1645 01/29/14 0233   01/28/14 1014  ceFAZolin (ANCEF) IVPB 2 g/50 mL premix     2 g100 mL/hr over 30 Minutes Intravenous 30 min pre-op 01/28/14 1014 01/28/14 1130       Patient was given sequential compression devices and early ambulation to prevent DVT.   Patient benefited maximally from hospital stay and there were no  complications. At the time of discharge, the patient was urinating/moving their bowels without difficulty, tolerating a regular diet, pain is controlled with oral pain medications and they have been cleared by PT/OT.   Recent vital signs: No data found.    Recent laboratory studies: No results for input(s): WBC, HGB, HCT, PLT, NA, K, CL, CO2, BUN, CREATININE, GLUCOSE, INR, CALCIUM in the last 72 hours.  Invalid input(s): PT, 2   Discharge Medications:     Medication List    STOP taking these medications        naproxen 500 MG tablet  Commonly known as:  NAPROSYN      TAKE these medications        docusate sodium 100 MG capsule  Commonly known as:  COLACE  Take 1 capsule (100 mg total) by mouth 2 (two) times daily.     methocarbamol 500 MG tablet  Commonly known as:  ROBAXIN  Take 1 tablet (500 mg total) by mouth every 6 (six) hours as needed for muscle spasms.     ondansetron 4 MG disintegrating tablet  Commonly known as:  ZOFRAN ODT  Take 1 tablet (4 mg total) by mouth every 8 (eight) hours as needed.     OVER THE COUNTER MEDICATION  Place 2 drops into both eyes as needed (for dry eyes). "Lubricating eye drops from store"     oxyCODONE-acetaminophen 10-325 MG per tablet  Commonly known as:  PERCOCET  Take 1 tablet by mouth every 6 (six) hours as needed for pain.     polyethylene glycol packet  Commonly known as:  MIRALAX / GLYCOLAX  Take 17 g by mouth daily.     pravastatin 20 MG tablet  Commonly known as:  PRAVACHOL  Take 20 mg by mouth daily.     quinapril 10 MG tablet  Commonly known as:  ACCUPRIL  Take 10 mg by mouth daily.     sitaGLIPtin-metformin 50-1000 MG per tablet  Commonly known as:  JANUMET  Take 1 tablet by mouth 2 (two) times daily with a meal.        Diagnostic Studies: Dg Chest 2 View  01/22/2014   CLINICAL DATA:  Preop for lumbar fusion. History of hypertension, diabetes and smoking.  EXAM: CHEST  2 VIEW  COMPARISON:  None.  FINDINGS:  Cardiac silhouette is normal in size. Aorta is mildly uncoiled and tortuous. No mediastinal or hilar masses or evidence of adenopathy. Lungs are mildly hyperexpanded but clear. No pleural effusion or pneumothorax.  Bony thorax is demineralized but intact.  IMPRESSION: No acute cardiopulmonary disease.   Electronically Signed   By: Amie Portlandavid  Ormond M.D.   On: 01/22/2014 11:42   Dg Lumbar Spine 2-3 Views  01/29/2014   CLINICAL DATA:  Postoperative fusion  EXAM: LUMBAR SPINE - 2-3 VIEW  COMPARISON:  01/28/2014  FINDINGS: Two views of lumbar spine submitted. There is right transpedicular metallic fixation screw and rod at L4-L5 level. Interbody metallic fusion plug is noted at L4-L5 disc space level. There is anatomic alignment.  IMPRESSION: Right transpedicular metallic fixation with metallic screws and rod at L4-L5 level. Interbody metallic fusion plug at L4-L5 level. There is anatomic alignment.   Electronically Signed   By: Natasha MeadLiviu  Pop M.D.   On: 01/29/2014 08:43   Dg Lumbar Spine 2-3 Views  01/28/2014   CLINICAL DATA:  Lumbar fusion  EXAM: LUMBAR SPINE - 2-3 VIEW  COMPARISON:  None.  FINDINGS: Two C-arm spot films show transpedicular screw fixation at L4-5. An interbody metallic fusion plug is present at L4-5 and it appears to be in good position.  IMPRESSION: Posterior fusion at L4-5.   Electronically Signed   By: Dwyane DeePaul  Barry M.D.   On: 01/28/2014 14:47   Dg C-arm Gt 120 Min  01/28/2014   CLINICAL DATA:  Posterior fusion at L4-5  EXAM: DG C-ARM GT 120 MIN  FLUOROSCOPY TIME:  17 seconds  COMPARISON:  None  FINDINGS: C-arm fluoroscopy was provided during fusion at C4-5.  IMPRESSION: C-arm fluoroscopy provided for up to 120 min.   Electronically Signed   By: Dwyane DeePaul  Barry M.D.   On: 01/28/2014 14:47        Discharge Instructions    Call MD / Call 911    Complete by:  As directed   If you experience chest pain or shortness of breath, CALL 911 and be transported to the hospital emergency room.  If you  develope a fever above 101 F, pus (white drainage) or increased drainage or redness at the wound, or calf pain, call your surgeon's office.     Call MD / Call 911    Complete by:  As directed   If you experience chest pain or shortness of breath, CALL 911 and be transported to the hospital emergency room.  If you develope a fever above 101 F, pus (white drainage) or increased drainage or redness at the wound, or calf pain, call your surgeon's office.  Constipation Prevention    Complete by:  As directed   Drink plenty of fluids.  Prune juice may be helpful.  You may use a stool softener, such as Colace (over the counter) 100 mg twice a day.  Use MiraLax (over the counter) for constipation as needed.     Constipation Prevention    Complete by:  As directed   Drink plenty of fluids.  Prune juice may be helpful.  You may use a stool softener, such as Colace (over the counter) 100 mg twice a day.  Use MiraLax (over the counter) for constipation as needed.     Diet - low sodium heart healthy    Complete by:  As directed      Diet - low sodium heart healthy    Complete by:  As directed      Discharge instructions    Complete by:  As directed   Ok to shower 5 days postop.  Do not apply any creams or ointments to incision.  Do not remove steri-strips.  Can use 4x4 gauze and tape for dressing changes.  No aggressive activity.  No bending, squatting or prolonged sitting.  Mostly be in reclined position or lying down.  Must wear brace at all times except when lying in bed.     Driving restrictions    Complete by:  As directed   No driving until further notice.     Increase activity slowly as tolerated    Complete by:  As directed      Increase activity slowly as tolerated    Complete by:  As directed      Lifting restrictions    Complete by:  As directed   No lifting until further notice.           Follow-up Information    Schedule an appointment as soon as possible for a visit with  Alvy BealBROOKS,DAHARI D, MD.   Specialty:  Orthopedic Surgery   Why:  need return office visit 2 weeks postop.  call to schedule appointment.     Contact information:   [address] [place] [address] [phone]       Discharge Plan:  discharge to home  Disposition:     Signed: Naida SleightWENS,Holten Spano M for Dr. Venita Lickahari Brooks [facility] [phone] 02/09/2014, 1:39 PM

## 2014-03-16 ENCOUNTER — Other Ambulatory Visit (HOSPITAL_COMMUNITY): Payer: Self-pay | Admitting: Orthopedic Surgery

## 2014-03-16 ENCOUNTER — Ambulatory Visit (HOSPITAL_COMMUNITY)
Admission: RE | Admit: 2014-03-16 | Discharge: 2014-03-16 | Disposition: A | Discharge status: Home / Self Care | Payer: Worker's Compensation | Source: Ambulatory Visit | Attending: Cardiovascular Disease | Admitting: Cardiovascular Disease

## 2014-03-16 DIAGNOSIS — M7989 Other specified soft tissue disorders: Secondary | ICD-10-CM | POA: Insufficient documentation

## 2014-03-16 DIAGNOSIS — M79605 Pain in left leg: Secondary | ICD-10-CM | POA: Diagnosis present

## 2014-03-16 NOTE — Progress Notes (Signed)
Left Lower Extremity Venous Duplex Completed. No evidence for DVT or SVT. °Brianna L Mazza,RVT °

## 2014-03-19 ENCOUNTER — Telehealth (HOSPITAL_COMMUNITY): Payer: Self-pay | Admitting: *Deleted

## 2015-04-20 IMAGING — CR DG CHEST 2V
2 series · 2 of 2 positions shown · non-contrast
Comparison: None.

CLINICAL DATA: Preop for lumbar fusion. History of hypertension,
diabetes and smoking.

EXAM:
CHEST  2 VIEW

[w chest pa]
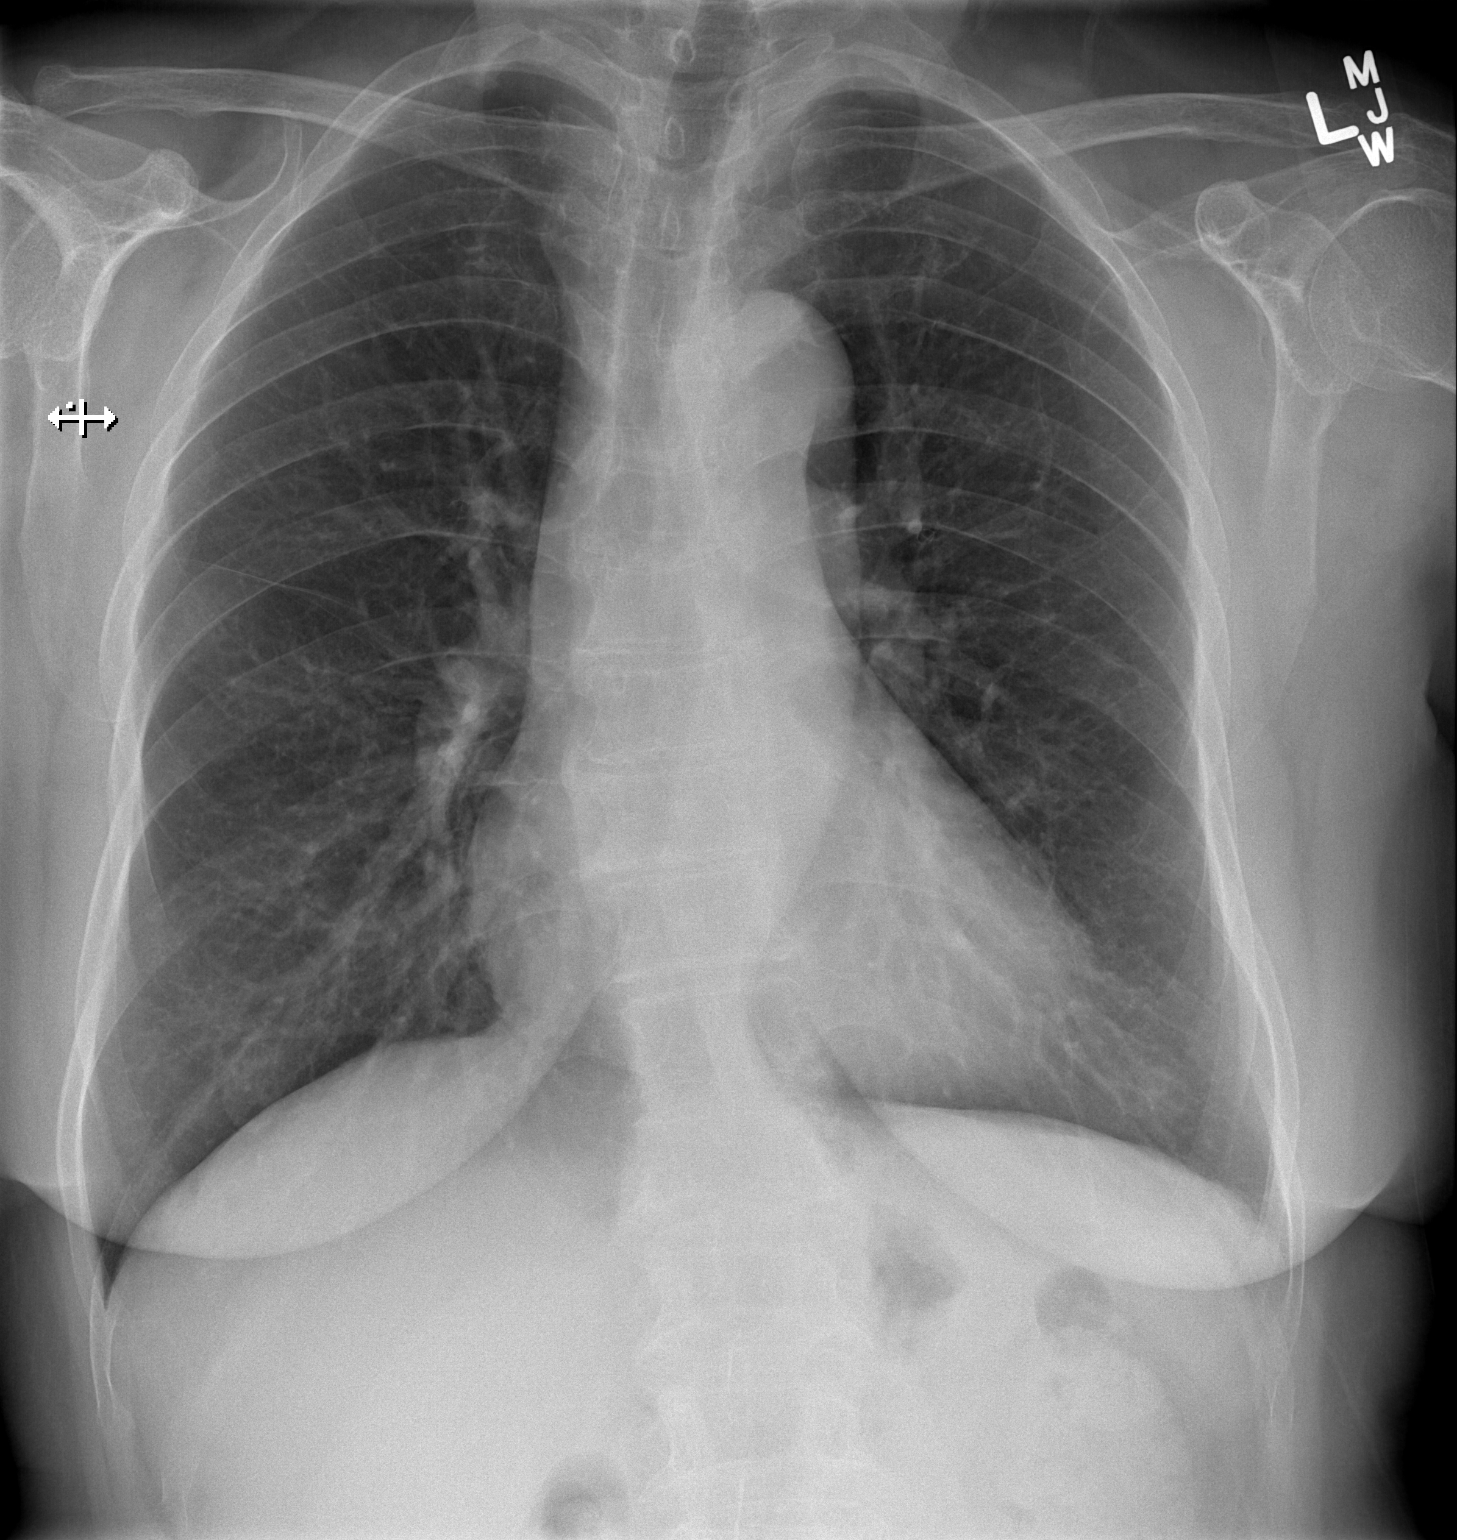

[w chest lat]
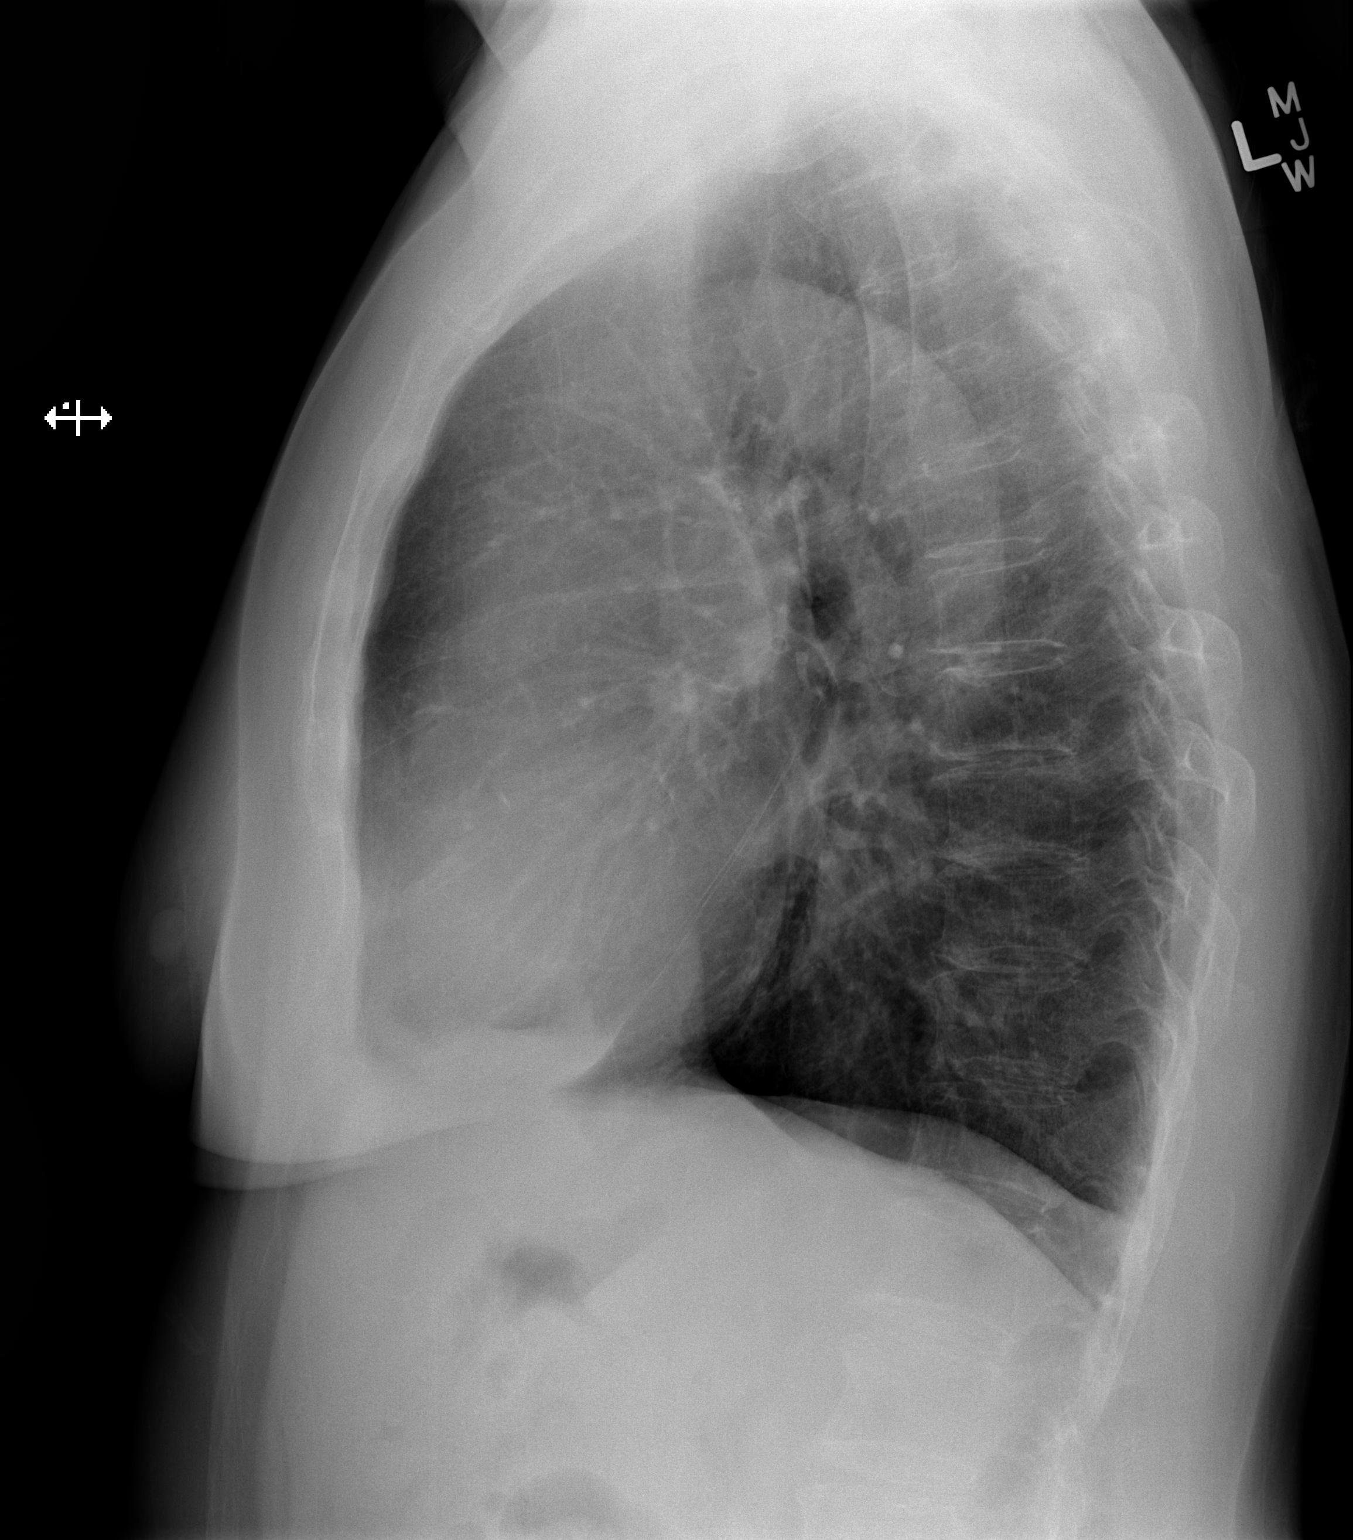

[2 of 2 positions shown; findings below may reference images not displayed]

FINDINGS: Cardiac silhouette is normal in size. Aorta is mildly uncoiled and
tortuous. No mediastinal or hilar masses or evidence of adenopathy.
Lungs are mildly hyperexpanded but clear. No pleural effusion or
pneumothorax.

Bony thorax is demineralized but intact.
IMPRESSION: No acute cardiopulmonary disease.

## 2015-04-26 IMAGING — RF DG C-ARM GT 120 MIN
1 series · 2 of 2 positions shown · non-contrast
Comparison: None

CLINICAL DATA: Posterior fusion at L4-5

EXAM:
DG C-ARM GT 120 MIN
FLUOROSCOPY TIME:  17 seconds

[Series 1: run · 2 of 2 slices shown]
[im 1/2]
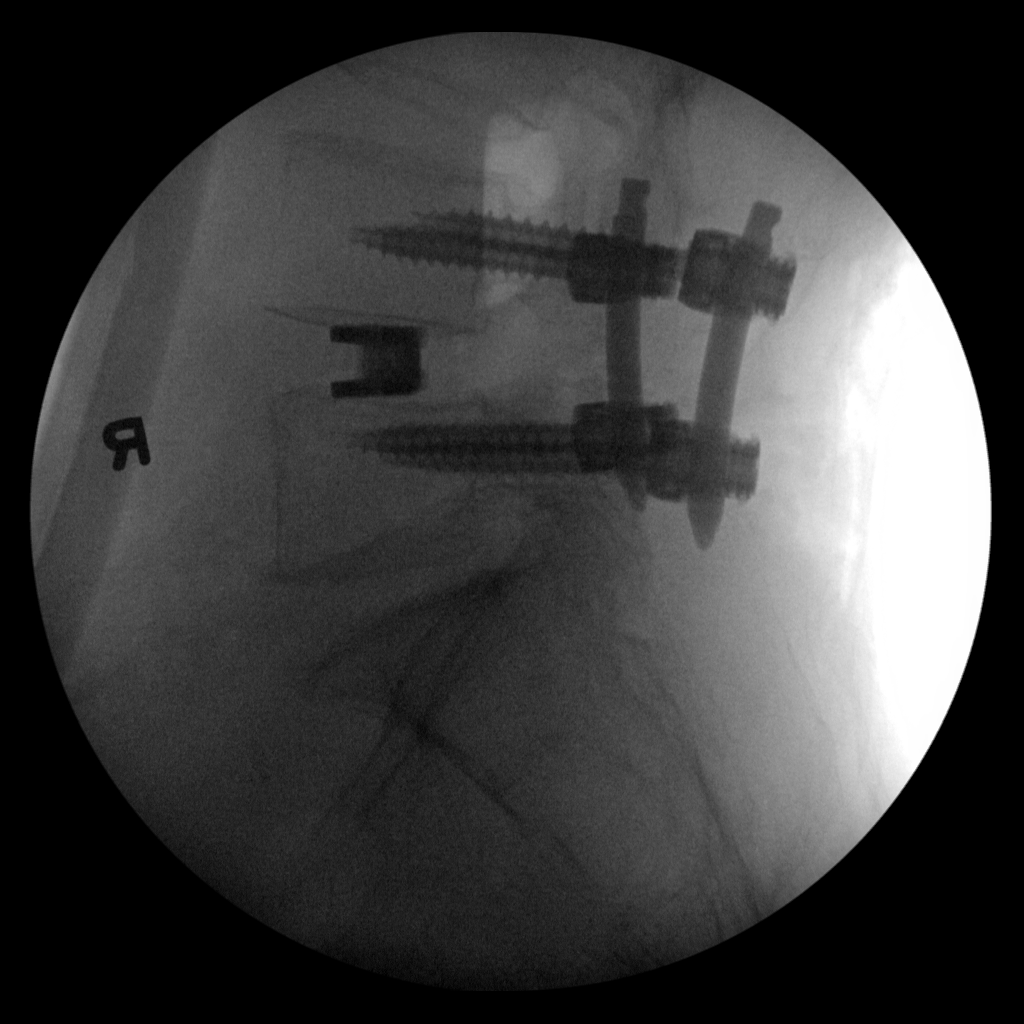
[im 2/2]
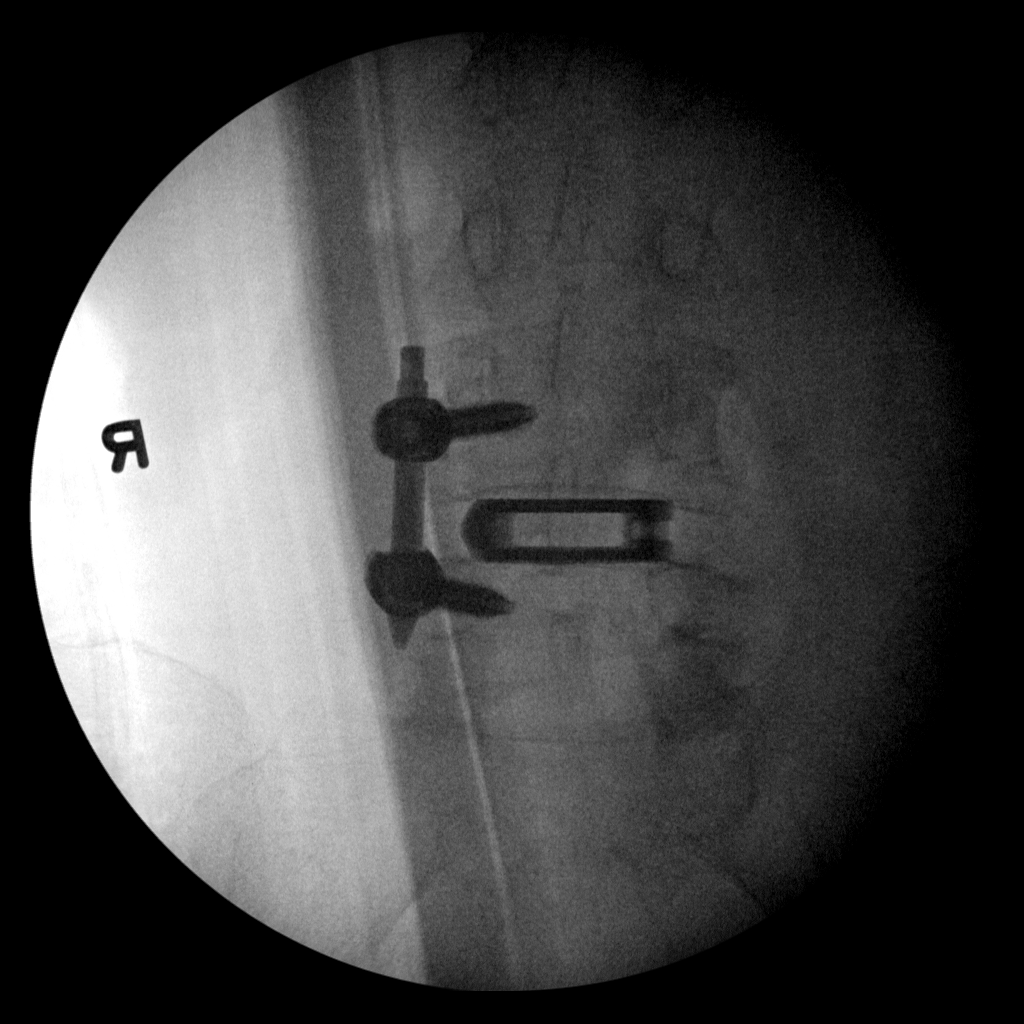

[2 of 2 positions shown; findings below may reference images not displayed]

FINDINGS: C-arm fluoroscopy was provided during fusion at C4-5.
IMPRESSION: C-arm fluoroscopy provided for up to 120 min.

## 2015-04-27 IMAGING — CR DG LUMBAR SPINE 2-3V
2 series · 2 of 2 positions shown · non-contrast
Comparison: 01/28/2014

CLINICAL DATA: Postoperative fusion

EXAM:
LUMBAR SPINE - 2-3 VIEW

[t l-spine a.p.]
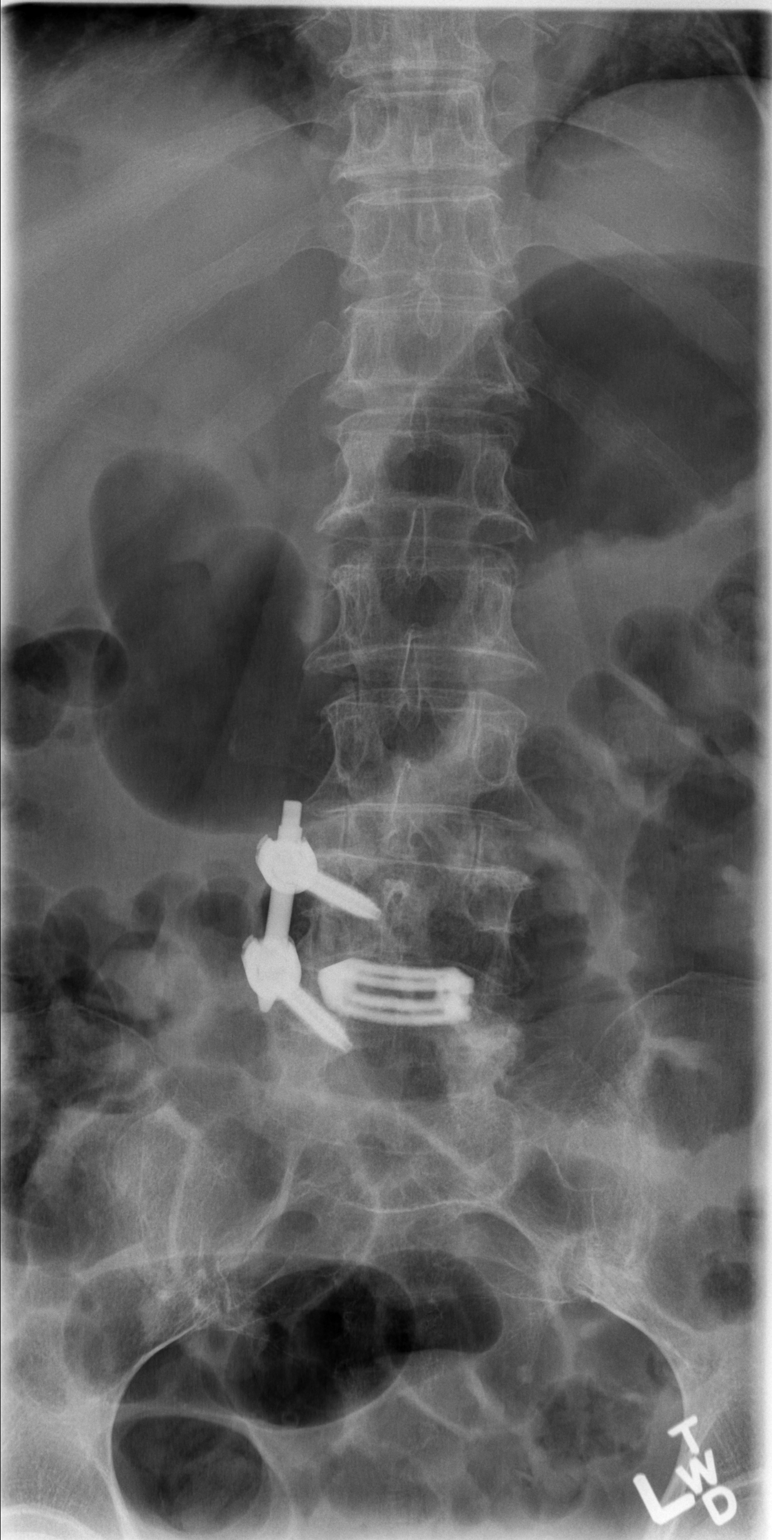

[t l-spine lat]
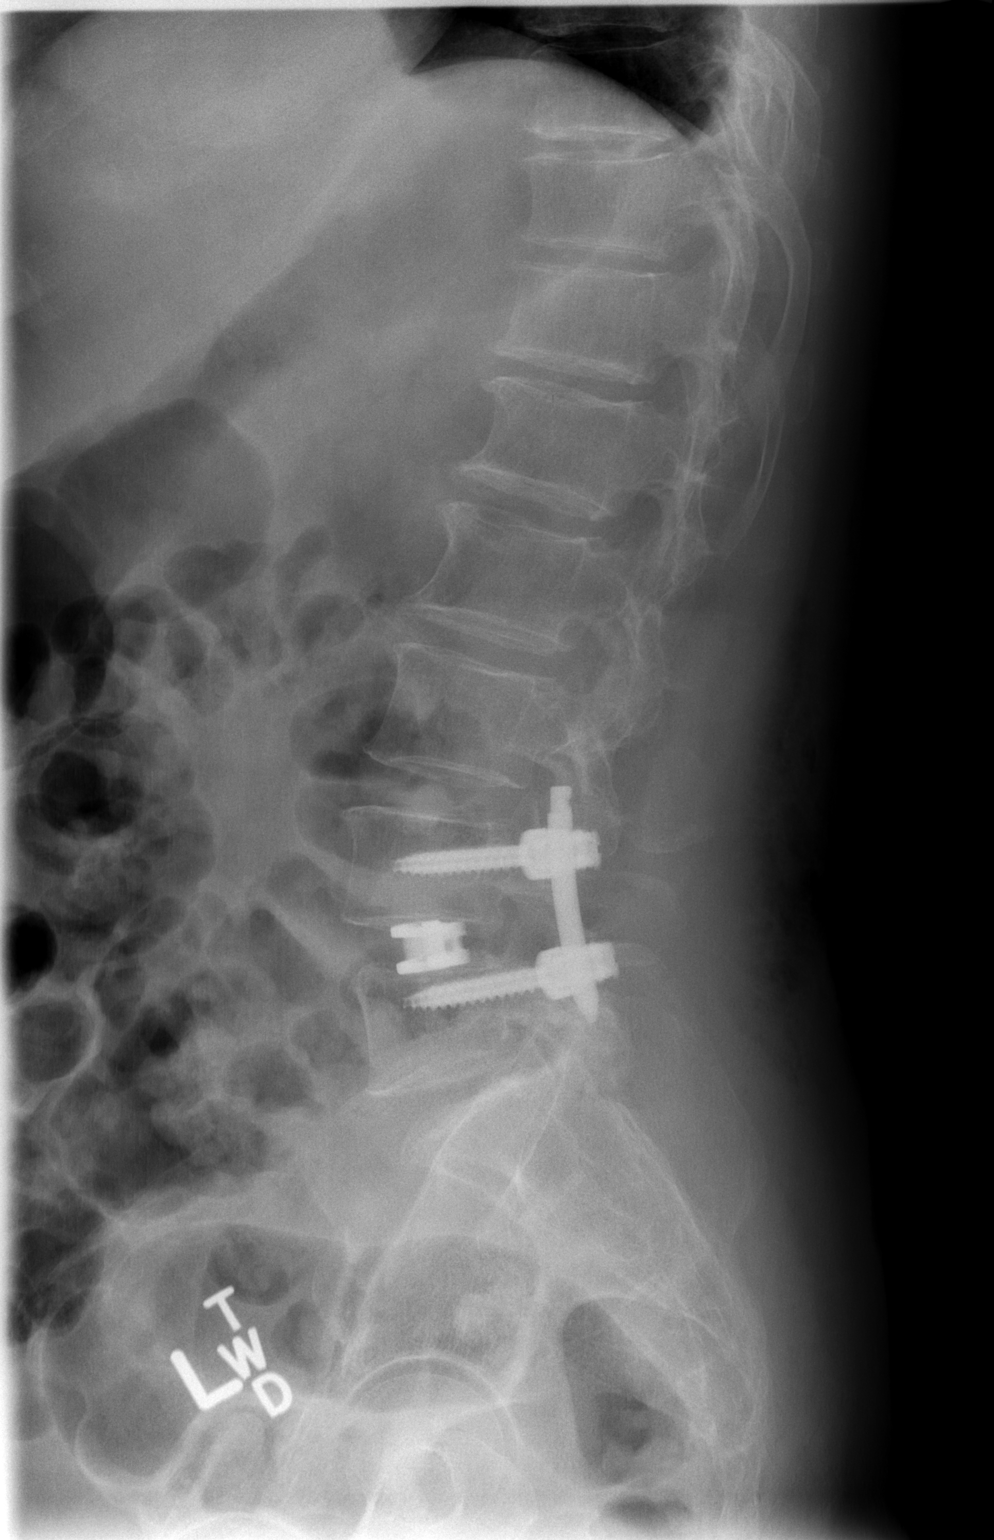

[2 of 2 positions shown; findings below may reference images not displayed]

FINDINGS: Two views of lumbar spine submitted. There is right transpedicular
metallic fixation screw and rod at L4-L5 level. Interbody metallic
fusion plug is noted at L4-L5 disc space level. There is anatomic
alignment.
IMPRESSION: Right transpedicular metallic fixation with metallic screws and rod
at L4-L5 level. Interbody metallic fusion plug at L4-L5 level. There
is anatomic alignment.

## 2017-07-01 DEATH — deceased
